# Patient Record
Sex: Female | Born: 1940
Health system: Southern US, Community
[De-identification: ages and names within clinical notes are randomized; demographics above are authoritative.]

## PROBLEM LIST (undated history)

## (undated) DIAGNOSIS — R0602 Shortness of breath: Secondary | ICD-10-CM

## (undated) DIAGNOSIS — K5900 Constipation, unspecified: Secondary | ICD-10-CM

## (undated) DIAGNOSIS — I6529 Occlusion and stenosis of unspecified carotid artery: Secondary | ICD-10-CM

## (undated) DIAGNOSIS — J40 Bronchitis, not specified as acute or chronic: Secondary | ICD-10-CM

## (undated) DIAGNOSIS — J449 Chronic obstructive pulmonary disease, unspecified: Secondary | ICD-10-CM

## (undated) DIAGNOSIS — K219 Gastro-esophageal reflux disease without esophagitis: Secondary | ICD-10-CM

## (undated) DIAGNOSIS — E039 Hypothyroidism, unspecified: Secondary | ICD-10-CM

## (undated) DIAGNOSIS — M1990 Unspecified osteoarthritis, unspecified site: Secondary | ICD-10-CM

## (undated) DIAGNOSIS — D751 Secondary polycythemia: Secondary | ICD-10-CM

## (undated) DIAGNOSIS — E785 Hyperlipidemia, unspecified: Secondary | ICD-10-CM

## (undated) DIAGNOSIS — M8580 Other specified disorders of bone density and structure, unspecified site: Secondary | ICD-10-CM

## (undated) DIAGNOSIS — I10 Essential (primary) hypertension: Secondary | ICD-10-CM

## (undated) DIAGNOSIS — F419 Anxiety disorder, unspecified: Secondary | ICD-10-CM

## (undated) DIAGNOSIS — Z9981 Dependence on supplemental oxygen: Secondary | ICD-10-CM

## (undated) DIAGNOSIS — Z87442 Personal history of urinary calculi: Secondary | ICD-10-CM

## (undated) DIAGNOSIS — E079 Disorder of thyroid, unspecified: Secondary | ICD-10-CM

## (undated) DIAGNOSIS — J440 Chronic obstructive pulmonary disease with acute lower respiratory infection: Secondary | ICD-10-CM

## (undated) HISTORY — DX: Bronchitis, not specified as acute or chronic: J40

## (undated) HISTORY — PX: TONSILLECTOMY: SUR1361

## (undated) HISTORY — DX: Chronic obstructive pulmonary disease with (acute) lower respiratory infection: J44.0

## (undated) HISTORY — DX: Occlusion and stenosis of unspecified carotid artery: I65.29

## (undated) HISTORY — DX: Other specified disorders of bone density and structure, unspecified site: M85.80

## (undated) HISTORY — DX: Gastro-esophageal reflux disease without esophagitis: K21.9

## (undated) HISTORY — PX: SUBCLAVIAN ARTERY STENT: SHX2452

## (undated) HISTORY — DX: Hyperlipidemia, unspecified: E78.5

## (undated) HISTORY — DX: Anxiety disorder, unspecified: F41.9

## (undated) HISTORY — DX: Chronic obstructive pulmonary disease, unspecified: J44.9

## (undated) HISTORY — DX: Essential (primary) hypertension: I10

## (undated) HISTORY — DX: Secondary polycythemia: D75.1

## (undated) HISTORY — DX: Disorder of thyroid, unspecified: E07.9

---

## 2011-04-01 HISTORY — PX: DIAGNOSTIC MAMMOGRAM: HXRAD719

## 2012-05-17 HISTORY — PX: OTHER SURGICAL HISTORY: SHX169

## 2012-07-30 ENCOUNTER — Other Ambulatory Visit: Payer: Self-pay | Admitting: *Deleted

## 2012-07-31 ENCOUNTER — Encounter: Payer: Self-pay | Admitting: Vascular Surgery

## 2012-08-19 ENCOUNTER — Encounter: Payer: Self-pay | Admitting: Vascular Surgery

## 2012-08-20 ENCOUNTER — Encounter: Payer: Self-pay | Admitting: Vascular Surgery

## 2012-08-20 ENCOUNTER — Other Ambulatory Visit (INDEPENDENT_AMBULATORY_CARE_PROVIDER_SITE_OTHER): Payer: Medicare PPO | Admitting: *Deleted

## 2012-08-20 ENCOUNTER — Ambulatory Visit (INDEPENDENT_AMBULATORY_CARE_PROVIDER_SITE_OTHER): Payer: Medicare PPO | Admitting: Vascular Surgery

## 2012-08-20 DIAGNOSIS — I6529 Occlusion and stenosis of unspecified carotid artery: Secondary | ICD-10-CM

## 2012-08-20 DIAGNOSIS — R0602 Shortness of breath: Secondary | ICD-10-CM | POA: Insufficient documentation

## 2012-08-20 NOTE — Progress Notes (Signed)
Subjective:     Patient ID: Margaret Villarreal, female   DOB: 07/07/1940, 71 y.o.   MRN: [id number]  HPI this 71-year-old female was referred by Dr. Schultz patient has had carotid artery disease followed for many years. The blockage on the left side has progressed to greater than 80% in severity. She has no history of stroke. She denies any evidence or history of lateralizing weakness, aphasia, amaurosis fugax, diplopia, blurred vision, or syncope. She does have a history of left subclavian artery stent placed in [place] for subclavian steal syndrome 5 years ago. She denies any transient weakness in her left upper extremity.  Past Medical History  Diagnosis Date  . Hyperlipidemia   . COPD (chronic obstructive pulmonary disease)   . Hypertension   . Thyroid disease     Hypo-Thyroidism  . Polycythemia, secondary   . Obstructive chronic bronchitis with acute bronchitis   . Bronchitis   . Anxiety   . Esophageal reflux   . Carotid artery occlusion     70% Left side and bilateral bruit    History  Substance Use Topics  . Smoking status: Current Every Day Smoker -- 1.00 packs/day    Types: Cigarettes  . Smokeless tobacco: Never Used  . Alcohol Use: No    Family History  Problem Relation Age of Onset  . Asthma Mother   . Heart attack Mother   . Diabetes Mother   . Thyroid disease Mother   . Heart disease Mother     Heart Disease before age 60  . Deep vein thrombosis Mother   . Heart attack Father   . Heart disease Father   . Thyroid disease Sister   . Arthritis Sister   . Diabetes Sister   . Cancer Sister     Skin  . Heart disease Sister   . Heart attack Sister   . Diabetes Brother     Allergies  Allergen Reactions  . Omnipaque (Iohexol) Anaphylaxis and Hives    I V dye  . Pepto-Bismol (Bismuth Subsalicylate) Nausea And Vomiting  . Hydrogen Peroxide Hives    Current outpatient prescriptions:ALPRAZolam (XANAX) 0.5 MG tablet, 2 (two) times daily., Disp: , Rfl: ;   aspirin 81 MG tablet, Take 81 mg by mouth daily., Disp: , Rfl: ;  atorvastatin (LIPITOR) 40 MG tablet, at bedtime., Disp: , Rfl: ;  clopidogrel (PLAVIX) 75 MG tablet, daily., Disp: , Rfl: ;  hydrochlorothiazide (MICROZIDE) 12.5 MG capsule, daily., Disp: , Rfl: ;  levothyroxine (SYNTHROID, LEVOTHROID) 88 MCG tablet, daily., Disp: , Rfl:  PROAIR HFA 108 (90 BASE) MCG/ACT inhaler, continuous as needed., Disp: , Rfl: ;  ranitidine (ZANTAC) 150 MG tablet, Take 150 mg by mouth at bedtime., Disp: , Rfl: ;  SPIRIVA HANDIHALER 18 MCG inhalation capsule, every morning., Disp: , Rfl: ;  ZETIA 10 MG tablet, daily., Disp: , Rfl:   BP 113/70  Pulse 88  Resp 16  Ht 5' (1.524 m)  Wt 140 lb (63.504 kg)  BMI 27.34 kg/m2  SpO2 100%  Body mass index is 27.34 kg/(m^2).           Review of Systems patient does complain of dyspnea on exertion, varicose veins, asthma, sleep apnea, weakness in arms and legs in the past, easy bruisability, and skin rashes. All other systems negative and complete review of systems     Objective:   Physical Exam blood pressure 113/70 heart rate 88 respirations 16 Gen.-alert and oriented x3 in no apparent distress HEENT   normal for age Lungs no rhonchi or wheezing Cardiovascular regular rhythm no murmurs carotid pulses 3+ palpable -harsh bruit on left Abdomen soft nontender no palpable masses Musculoskeletal free of  major deformities Skin clear -no rashes Neurologic normal Lower extremities 3+ femoral and dorsalis pedis pulses palpable bilaterally with no edema  Today I ordered a carotid duplex exam of the left carotid artery which I reviewed and interpreted and compared to previous studies. Patient does have a knee 5-90% left ICA stenosis with mild disease on the contralateral right side on previous exams.       Assessment:     Severe left ICA stenosis-asymptomatic Patient currently on Plavix and aspirin History of COPD and tobacco abuse    Plan:     Patient  needs left carotid endarterectomy-scheduled for Thursday, July 24 Patient will discontinue Plavix after 08/31/2012 Patient will continue aspirin Risks and benefits of surgery thoroughly discussed with patient and her family and they understand and would like to proceed with left carotid surgery      

## 2012-08-27 ENCOUNTER — Other Ambulatory Visit: Payer: Self-pay | Admitting: *Deleted

## 2012-08-27 ENCOUNTER — Encounter (HOSPITAL_COMMUNITY): Payer: Self-pay | Admitting: Pharmacy Technician

## 2012-08-30 ENCOUNTER — Encounter (HOSPITAL_COMMUNITY): Payer: Self-pay

## 2012-08-30 ENCOUNTER — Encounter (HOSPITAL_COMMUNITY)
Admission: RE | Admit: 2012-08-30 | Discharge: 2012-08-30 | Disposition: A | Discharge status: Home / Self Care | Payer: Medicare HMO | Source: Ambulatory Visit | Attending: Anesthesiology | Admitting: Anesthesiology

## 2012-08-30 ENCOUNTER — Ambulatory Visit (HOSPITAL_COMMUNITY): Payer: Medicare HMO

## 2012-08-30 ENCOUNTER — Encounter (HOSPITAL_COMMUNITY)
Admission: RE | Admit: 2012-08-30 | Discharge: 2012-08-30 | Disposition: A | Discharge status: Home / Self Care | Payer: Medicare HMO | Source: Ambulatory Visit | Attending: Vascular Surgery | Admitting: Vascular Surgery

## 2012-08-30 DIAGNOSIS — Z01818 Encounter for other preprocedural examination: Secondary | ICD-10-CM | POA: Insufficient documentation

## 2012-08-30 DIAGNOSIS — Z01811 Encounter for preprocedural respiratory examination: Secondary | ICD-10-CM | POA: Insufficient documentation

## 2012-08-30 DIAGNOSIS — Z01812 Encounter for preprocedural laboratory examination: Secondary | ICD-10-CM | POA: Insufficient documentation

## 2012-08-30 HISTORY — DX: Personal history of urinary calculi: Z87.442

## 2012-08-30 HISTORY — DX: Dependence on supplemental oxygen: Z99.81

## 2012-08-30 HISTORY — DX: Shortness of breath: R06.02

## 2012-08-30 HISTORY — DX: Constipation, unspecified: K59.00

## 2012-08-30 HISTORY — DX: Hypothyroidism, unspecified: E03.9

## 2012-08-30 HISTORY — DX: Unspecified osteoarthritis, unspecified site: M19.90

## 2012-08-30 LAB — URINE MICROSCOPIC-ADD ON

## 2012-08-30 LAB — CBC
HCT: 44 % (ref 36.0–46.0)
Hemoglobin: 15.6 g/dL — ABNORMAL HIGH (ref 12.0–15.0)
MCH: 32.4 pg (ref 26.0–34.0)
MCHC: 35.5 g/dL (ref 30.0–36.0)
MCV: 91.3 fL (ref 78.0–100.0)
RDW: 13.2 % (ref 11.5–15.5)

## 2012-08-30 LAB — COMPREHENSIVE METABOLIC PANEL
ALT: 10 U/L (ref 0–35)
AST: 15 U/L (ref 0–37)
Albumin: 3.8 g/dL (ref 3.5–5.2)
Alkaline Phosphatase: 135 U/L — ABNORMAL HIGH (ref 39–117)
BUN: 11 mg/dL (ref 6–23)
CO2: 28 mEq/L (ref 19–32)
Calcium: 9.4 mg/dL (ref 8.4–10.5)
Chloride: 103 mEq/L (ref 96–112)
Creatinine, Ser: 0.64 mg/dL (ref 0.50–1.10)
GFR calc Af Amer: >90 mL/min (ref >90)
GFR calc non Af Amer: 89 mL/min — ABNORMAL LOW (ref >90)
Glucose, Bld: 91 mg/dL (ref 70–99)
Potassium: 3.3 mEq/L — ABNORMAL LOW (ref 3.5–5.1)
Sodium: 140 mEq/L (ref 135–145)
Total Bilirubin: 0.7 mg/dL (ref 0.3–1.2)
Total Protein: 7.1 g/dL (ref 6.0–8.3)

## 2012-08-30 LAB — URINALYSIS, ROUTINE W REFLEX MICROSCOPIC
Bilirubin Urine: NEGATIVE
Glucose, UA: NEGATIVE mg/dL
Hgb urine dipstick: NEGATIVE
Protein, ur: NEGATIVE mg/dL
Specific Gravity, Urine: 1.008 (ref 1.005–1.030)
Urobilinogen, UA: 0.2 mg/dL (ref 0.0–1.0)

## 2012-08-30 LAB — PROTIME-INR
INR: 0.95 (ref 0.00–1.49)
Prothrombin Time: 12.5 seconds (ref 11.6–15.2)

## 2012-08-30 LAB — SURGICAL PCR SCREEN
MRSA, PCR: NEGATIVE
Staphylococcus aureus: NEGATIVE

## 2012-08-30 LAB — APTT: aPTT: 29 seconds (ref 24–37)

## 2012-08-30 NOTE — Pre-Procedure Instructions (Signed)
Margaret Villarreal  08/30/2012   Your procedure is scheduled on:  Thursday, July 24th.  Report to [facility] at 5:30 AM.  Call this number if you have problems the morning of surgery: [phone]   Remember:   Do not eat food or drink liquids after midnight.   Take these medicines the morning of surgery with A SIP OF WATER: levothyroxine (SYNTHROID, LEVOTHROID).  Use inhalers.     May take  ALPRAZolam Prudy Feeler) if needed    Do not wear jewelry, make-up or nail polish.  Do not wear lotions, powders, or perfumes. You may wear deodorant.  Do not shave 48 hours prior to surgery.   Do not bring valuables to the hospital.  [facility] is not responsible for any belongings or valuables.  Contacts, dentures or bridgework may not be worn into surgery.  Leave suitcase in the car. After surgery it may be brought to your room.  For patients admitted to the hospital, checkout time is 11:00 AM the day of discharge.   Patients discharged the day of surgery will not be allowed to drive home.  Name and phone number of your driver: -   Special Instructions: Shower using CHG 2 nights before surgery and the night before surgery.  If you shower the day of surgery use CHG.  Use special wash - you have one bottle of CHG for all showers.  You should use approximately 1/3 of the bottle for each shower.   Please read over the following fact sheets that you were given: Pain Booklet, Coughing and Deep Breathing, Blood Transfusion Information and Surgical Site Infection Prevention

## 2012-08-30 NOTE — Progress Notes (Signed)
Pt has seen Dr Kirtland Bouchard at [facility] in the past- "not in a while"  Denies chest pain states that she had a stress test at Dr Charm Rings office- I requested results.  Pt was instructed to stop Plavix after Sat &/19/14.

## 2012-08-31 LAB — ABO/RH: ABO/RH(D): A NEG

## 2012-09-04 ENCOUNTER — Encounter: Payer: Self-pay | Admitting: Internal Medicine

## 2012-09-04 MED ORDER — SODIUM CHLORIDE 0.9 % IV SOLN
INTRAVENOUS | Status: DC
  Administered 2012-09-05: 07:00:00 via INTRAVENOUS

## 2012-09-04 MED ORDER — DEXTROSE 5 % IV SOLN
1.5000 g | INTRAVENOUS | Status: AC
  Administered 2012-09-05: 1.5 g via INTRAVENOUS
  Filled 2012-09-04: qty 1.5

## 2012-09-05 ENCOUNTER — Inpatient Hospital Stay (HOSPITAL_COMMUNITY): Payer: Medicare HMO | Admitting: Anesthesiology

## 2012-09-05 ENCOUNTER — Telehealth: Payer: Self-pay | Admitting: Vascular Surgery

## 2012-09-05 ENCOUNTER — Encounter (HOSPITAL_COMMUNITY): Payer: Self-pay | Admitting: Anesthesiology

## 2012-09-05 ENCOUNTER — Encounter (HOSPITAL_COMMUNITY)
Admission: RE | Disposition: A | Discharge status: Home / Self Care | Payer: Self-pay | Source: Ambulatory Visit | Attending: Vascular Surgery

## 2012-09-05 ENCOUNTER — Encounter (HOSPITAL_COMMUNITY): Payer: Self-pay | Admitting: *Deleted

## 2012-09-05 ENCOUNTER — Inpatient Hospital Stay (HOSPITAL_COMMUNITY)
Admission: RE | Admit: 2012-09-05 | Discharge: 2012-09-06 | DRG: 039 | Disposition: A | Discharge status: Home / Self Care | Payer: Medicare HMO | Source: Ambulatory Visit | Attending: Vascular Surgery | Admitting: Vascular Surgery

## 2012-09-05 DIAGNOSIS — I10 Essential (primary) hypertension: Secondary | ICD-10-CM | POA: Diagnosis present

## 2012-09-05 DIAGNOSIS — F411 Generalized anxiety disorder: Secondary | ICD-10-CM | POA: Diagnosis present

## 2012-09-05 DIAGNOSIS — K219 Gastro-esophageal reflux disease without esophagitis: Secondary | ICD-10-CM | POA: Diagnosis present

## 2012-09-05 DIAGNOSIS — I6529 Occlusion and stenosis of unspecified carotid artery: Principal | ICD-10-CM | POA: Diagnosis present

## 2012-09-05 DIAGNOSIS — M19049 Primary osteoarthritis, unspecified hand: Secondary | ICD-10-CM | POA: Diagnosis present

## 2012-09-05 DIAGNOSIS — Z7902 Long term (current) use of antithrombotics/antiplatelets: Secondary | ICD-10-CM

## 2012-09-05 DIAGNOSIS — K5900 Constipation, unspecified: Secondary | ICD-10-CM | POA: Diagnosis present

## 2012-09-05 DIAGNOSIS — Z7982 Long term (current) use of aspirin: Secondary | ICD-10-CM

## 2012-09-05 DIAGNOSIS — D751 Secondary polycythemia: Secondary | ICD-10-CM | POA: Diagnosis present

## 2012-09-05 DIAGNOSIS — E785 Hyperlipidemia, unspecified: Secondary | ICD-10-CM | POA: Diagnosis present

## 2012-09-05 DIAGNOSIS — F17200 Nicotine dependence, unspecified, uncomplicated: Secondary | ICD-10-CM | POA: Diagnosis present

## 2012-09-05 DIAGNOSIS — E039 Hypothyroidism, unspecified: Secondary | ICD-10-CM | POA: Diagnosis present

## 2012-09-05 DIAGNOSIS — J4489 Other specified chronic obstructive pulmonary disease: Secondary | ICD-10-CM | POA: Diagnosis present

## 2012-09-05 DIAGNOSIS — Z79899 Other long term (current) drug therapy: Secondary | ICD-10-CM

## 2012-09-05 DIAGNOSIS — E119 Type 2 diabetes mellitus without complications: Secondary | ICD-10-CM | POA: Diagnosis present

## 2012-09-05 DIAGNOSIS — J449 Chronic obstructive pulmonary disease, unspecified: Secondary | ICD-10-CM | POA: Diagnosis present

## 2012-09-05 HISTORY — PX: CAROTID ENDARTERECTOMY: SUR193

## 2012-09-05 HISTORY — PX: ENDARTERECTOMY: SHX5162

## 2012-09-05 LAB — CBC
HCT: 40 % (ref 36.0–46.0)
Hemoglobin: 14 g/dL (ref 12.0–15.0)
MCV: 90.7 fL (ref 78.0–100.0)
RBC: 4.41 MIL/uL (ref 3.87–5.11)
RDW: 13.1 % (ref 11.5–15.5)
WBC: 12.6 10*3/uL — ABNORMAL HIGH (ref 4.0–10.5)

## 2012-09-05 LAB — CREATININE, SERUM
GFR calc Af Amer: >90 mL/min (ref >90)
GFR calc non Af Amer: >90 mL/min (ref >90)

## 2012-09-05 SURGERY — ENDARTERECTOMY, CAROTID
Anesthesia: General | Site: Neck | Laterality: Left | Wound class: Clean

## 2012-09-05 MED ORDER — HYDRALAZINE HCL 20 MG/ML IJ SOLN: 10.0000 mg | INTRAMUSCULAR | Status: DC | PRN

## 2012-09-05 MED ORDER — NEOSTIGMINE METHYLSULFATE 1 MG/ML IJ SOLN
INTRAMUSCULAR | Status: DC | PRN
  Administered 2012-09-05: 3 mg via INTRAVENOUS

## 2012-09-05 MED ORDER — ARTIFICIAL TEARS OP OINT
TOPICAL_OINTMENT | OPHTHALMIC | Status: DC | PRN
  Administered 2012-09-05: 1 via OPHTHALMIC

## 2012-09-05 MED ORDER — HYDROMORPHONE HCL PF 1 MG/ML IJ SOLN
INTRAMUSCULAR | Status: AC
  Filled 2012-09-05: qty 1

## 2012-09-05 MED ORDER — OXYCODONE HCL 5 MG PO TABS: 5.0000 mg | ORAL_TABLET | Freq: Once | ORAL | Status: DC | PRN

## 2012-09-05 MED ORDER — SODIUM CHLORIDE 0.9 % IV SOLN: 500.0000 mL | Freq: Once | INTRAVENOUS | Status: AC | PRN

## 2012-09-05 MED ORDER — ALBUTEROL SULFATE HFA 108 (90 BASE) MCG/ACT IN AERS: 2.0000 | INHALATION_SPRAY | Freq: Four times a day (QID) | RESPIRATORY_TRACT | Status: DC | PRN

## 2012-09-05 MED ORDER — DEXTROSE 5 % IV SOLN
1.5000 g | Freq: Two times a day (BID) | INTRAVENOUS | Status: DC
  Administered 2012-09-05: 1.5 g via INTRAVENOUS
  Filled 2012-09-05 (×2): qty 1.5

## 2012-09-05 MED ORDER — ESMOLOL HCL 10 MG/ML IV SOLN
INTRAVENOUS | Status: DC | PRN
  Administered 2012-09-05: 40 mg via INTRAVENOUS

## 2012-09-05 MED ORDER — ONDANSETRON HCL 4 MG/2ML IJ SOLN
INTRAMUSCULAR | Status: DC | PRN
  Administered 2012-09-05: 4 mg via INTRAVENOUS

## 2012-09-05 MED ORDER — MORPHINE SULFATE 2 MG/ML IJ SOLN: 2.0000 mg | INTRAMUSCULAR | Status: DC | PRN

## 2012-09-05 MED ORDER — ONDANSETRON HCL 4 MG/2ML IJ SOLN: 4.0000 mg | Freq: Four times a day (QID) | INTRAMUSCULAR | Status: DC | PRN

## 2012-09-05 MED ORDER — HYDROMORPHONE HCL PF 1 MG/ML IJ SOLN
0.2500 mg | INTRAMUSCULAR | Status: DC | PRN
  Administered 2012-09-05 (×2): 0.5 mg via INTRAVENOUS

## 2012-09-05 MED ORDER — GLYCOPYRROLATE 0.2 MG/ML IJ SOLN
INTRAMUSCULAR | Status: DC | PRN
  Administered 2012-09-05: 0.4 mg via INTRAVENOUS

## 2012-09-05 MED ORDER — TIOTROPIUM BROMIDE MONOHYDRATE 18 MCG IN CAPS: 18.0000 ug | ORAL_CAPSULE | Freq: Every morning | RESPIRATORY_TRACT | Status: DC

## 2012-09-05 MED ORDER — MOMETASONE FURO-FORMOTEROL FUM 100-5 MCG/ACT IN AERO
2.0000 | INHALATION_SPRAY | Freq: Two times a day (BID) | RESPIRATORY_TRACT | Status: DC
  Administered 2012-09-05 – 2012-09-06 (×2): 2 via RESPIRATORY_TRACT
  Filled 2012-09-05: qty 8.8

## 2012-09-05 MED ORDER — TIOTROPIUM BROMIDE MONOHYDRATE 18 MCG IN CAPS
18.0000 ug | ORAL_CAPSULE | Freq: Every day | RESPIRATORY_TRACT | Status: DC
  Administered 2012-09-06: 18 ug via RESPIRATORY_TRACT
  Filled 2012-09-05: qty 5

## 2012-09-05 MED ORDER — FAMOTIDINE 20 MG PO TABS
20.0000 mg | ORAL_TABLET | Freq: Every day | ORAL | Status: DC
  Administered 2012-09-05: 20 mg via ORAL
  Filled 2012-09-05 (×2): qty 1

## 2012-09-05 MED ORDER — LACTATED RINGERS IV SOLN
INTRAVENOUS | Status: DC | PRN
  Administered 2012-09-05: 07:00:00 via INTRAVENOUS

## 2012-09-05 MED ORDER — GUAIFENESIN-DM 100-10 MG/5ML PO SYRP: 15.0000 mL | ORAL_SOLUTION | ORAL | Status: DC | PRN

## 2012-09-05 MED ORDER — ASPIRIN EC 81 MG PO TBEC
81.0000 mg | DELAYED_RELEASE_TABLET | Freq: Every day | ORAL | Status: DC
  Administered 2012-09-05 – 2012-09-06 (×2): 81 mg via ORAL
  Filled 2012-09-05 (×2): qty 1

## 2012-09-05 MED ORDER — HYDROCHLOROTHIAZIDE 12.5 MG PO CAPS
12.5000 mg | ORAL_CAPSULE | Freq: Every day | ORAL | Status: DC
  Administered 2012-09-06: 12.5 mg via ORAL
  Filled 2012-09-05 (×2): qty 1

## 2012-09-05 MED ORDER — OXYCODONE HCL 5 MG PO TABS: 5.0000 mg | ORAL_TABLET | Freq: Four times a day (QID) | ORAL | Status: DC | PRN

## 2012-09-05 MED ORDER — PHENYLEPHRINE HCL 10 MG/ML IJ SOLN
10.0000 mg | INTRAVENOUS | Status: DC | PRN
  Administered 2012-09-05: 25 ug/min via INTRAVENOUS

## 2012-09-05 MED ORDER — DOCUSATE SODIUM 100 MG PO CAPS
100.0000 mg | ORAL_CAPSULE | Freq: Every day | ORAL | Status: DC
  Administered 2012-09-06: 100 mg via ORAL
  Filled 2012-09-05: qty 1

## 2012-09-05 MED ORDER — ALUM & MAG HYDROXIDE-SIMETH 200-200-20 MG/5ML PO SUSP: 15.0000 mL | ORAL | Status: DC | PRN

## 2012-09-05 MED ORDER — DOPAMINE-DEXTROSE 3.2-5 MG/ML-% IV SOLN: 3.0000 ug/kg/min | INTRAVENOUS | Status: DC

## 2012-09-05 MED ORDER — PROMETHAZINE HCL 25 MG/ML IJ SOLN
INTRAMUSCULAR | Status: AC
  Filled 2012-09-05: qty 1

## 2012-09-05 MED ORDER — POTASSIUM CHLORIDE CRYS ER 20 MEQ PO TBCR: 20.0000 meq | EXTENDED_RELEASE_TABLET | Freq: Once | ORAL | Status: AC | PRN

## 2012-09-05 MED ORDER — POLYETHYLENE GLYCOL 3350 17 G PO PACK
17.0000 g | PACK | Freq: Every day | ORAL | Status: DC
  Administered 2012-09-05: 17 g via ORAL
  Filled 2012-09-05 (×2): qty 1

## 2012-09-05 MED ORDER — ACETAMINOPHEN 325 MG PO TABS
325.0000 mg | ORAL_TABLET | ORAL | Status: DC | PRN
  Administered 2012-09-06: 650 mg via ORAL
  Filled 2012-09-05: qty 2

## 2012-09-05 MED ORDER — ALPRAZOLAM 0.5 MG PO TABS: 0.5000 mg | ORAL_TABLET | Freq: Every day | ORAL | Status: DC | PRN

## 2012-09-05 MED ORDER — LEVOTHYROXINE SODIUM 88 MCG PO TABS
88.0000 ug | ORAL_TABLET | Freq: Every day | ORAL | Status: DC
  Administered 2012-09-06: 88 ug via ORAL
  Filled 2012-09-05 (×2): qty 1

## 2012-09-05 MED ORDER — 0.9 % SODIUM CHLORIDE (POUR BTL) OPTIME
TOPICAL | Status: DC | PRN
  Administered 2012-09-05: 1000 mL

## 2012-09-05 MED ORDER — EZETIMIBE 10 MG PO TABS
10.0000 mg | ORAL_TABLET | Freq: Every day | ORAL | Status: DC
  Administered 2012-09-05 – 2012-09-06 (×2): 10 mg via ORAL
  Filled 2012-09-05 (×2): qty 1

## 2012-09-05 MED ORDER — PHENOL 1.4 % MT LIQD: 1.0000 | OROMUCOSAL | Status: DC | PRN

## 2012-09-05 MED ORDER — HEPARIN SODIUM (PORCINE) 1000 UNIT/ML IJ SOLN
INTRAMUSCULAR | Status: DC | PRN
  Administered 2012-09-05: 6000 [IU] via INTRAVENOUS

## 2012-09-05 MED ORDER — PROMETHAZINE HCL 25 MG/ML IJ SOLN
6.2500 mg | INTRAMUSCULAR | Status: DC | PRN
  Administered 2012-09-05: 6.25 mg via INTRAVENOUS

## 2012-09-05 MED ORDER — LIDOCAINE HCL (CARDIAC) 20 MG/ML IV SOLN
INTRAVENOUS | Status: DC | PRN
  Administered 2012-09-05: 80 mg via INTRAVENOUS

## 2012-09-05 MED ORDER — SODIUM CHLORIDE 0.9 % IR SOLN
Status: DC | PRN
  Administered 2012-09-05: 08:00:00

## 2012-09-05 MED ORDER — ROCURONIUM BROMIDE 100 MG/10ML IV SOLN
INTRAVENOUS | Status: DC | PRN
  Administered 2012-09-05: 40 mg via INTRAVENOUS
  Administered 2012-09-05: 10 mg via INTRAVENOUS

## 2012-09-05 MED ORDER — CLOPIDOGREL BISULFATE 75 MG PO TABS
75.0000 mg | ORAL_TABLET | Freq: Every day | ORAL | Status: DC
  Administered 2012-09-06: 75 mg via ORAL
  Filled 2012-09-05: qty 1

## 2012-09-05 MED ORDER — ACETAMINOPHEN 650 MG RE SUPP: 325.0000 mg | RECTAL | Status: DC | PRN

## 2012-09-05 MED ORDER — LABETALOL HCL 5 MG/ML IV SOLN: 10.0000 mg | INTRAVENOUS | Status: DC | PRN

## 2012-09-05 MED ORDER — FENTANYL CITRATE 0.05 MG/ML IJ SOLN
INTRAMUSCULAR | Status: DC | PRN
  Administered 2012-09-05: 50 ug via INTRAVENOUS
  Administered 2012-09-05 (×2): 100 ug via INTRAVENOUS

## 2012-09-05 MED ORDER — PROPOFOL 10 MG/ML IV BOLUS
INTRAVENOUS | Status: DC | PRN
  Administered 2012-09-05: 150 mg via INTRAVENOUS
  Administered 2012-09-05: 20 mg via INTRAVENOUS

## 2012-09-05 MED ORDER — METOPROLOL TARTRATE 1 MG/ML IV SOLN: 2.0000 mg | INTRAVENOUS | Status: DC | PRN

## 2012-09-05 MED ORDER — OXYCODONE HCL 5 MG/5ML PO SOLN: 5.0000 mg | Freq: Once | ORAL | Status: DC | PRN

## 2012-09-05 MED ORDER — ENOXAPARIN SODIUM 30 MG/0.3ML ~~LOC~~ SOLN
30.0000 mg | SUBCUTANEOUS | Status: DC
  Administered 2012-09-06: 30 mg via SUBCUTANEOUS
  Filled 2012-09-05: qty 0.3

## 2012-09-05 MED ORDER — OXYCODONE HCL 5 MG PO TABS: 5.0000 mg | ORAL_TABLET | ORAL | Status: DC | PRN

## 2012-09-05 MED ORDER — LIDOCAINE HCL (PF) 1 % IJ SOLN
INTRAMUSCULAR | Status: AC
  Filled 2012-09-05: qty 30

## 2012-09-05 MED ORDER — ATORVASTATIN CALCIUM 40 MG PO TABS
40.0000 mg | ORAL_TABLET | Freq: Every day | ORAL | Status: DC
  Administered 2012-09-05 – 2012-09-06 (×2): 40 mg via ORAL
  Filled 2012-09-05 (×2): qty 1

## 2012-09-05 MED ORDER — SODIUM CHLORIDE 0.9 % IV SOLN: INTRAVENOUS | Status: DC

## 2012-09-05 MED ORDER — PROTAMINE SULFATE 10 MG/ML IV SOLN
INTRAVENOUS | Status: DC | PRN
  Administered 2012-09-05 (×2): 20 mg via INTRAVENOUS
  Administered 2012-09-05: 10 mg via INTRAVENOUS

## 2012-09-05 SURGICAL SUPPLY — 42 items
CANISTER SUCTION 2500CC (MISCELLANEOUS) ×2 IMPLANT
CATH ROBINSON RED A/P 18FR (CATHETERS) ×2 IMPLANT
CATH SUCT 10FR WHISTLE TIP (CATHETERS) ×2 IMPLANT
CLIP TI MEDIUM 24 (CLIP) ×2 IMPLANT
CLIP TI WIDE RED SMALL 24 (CLIP) ×2 IMPLANT
CLOTH BEACON ORANGE TIMEOUT ST (SAFETY) ×2 IMPLANT
COVER SURGICAL LIGHT HANDLE (MISCELLANEOUS) ×2 IMPLANT
CRADLE DONUT ADULT HEAD (MISCELLANEOUS) ×2 IMPLANT
DECANTER SPIKE VIAL GLASS SM (MISCELLANEOUS) IMPLANT
DRAIN HEMOVAC 1/8 X 5 (WOUND CARE) IMPLANT
DRAPE WARM FLUID 44X44 (DRAPE) ×2 IMPLANT
DRSG COVADERM 4X8 (GAUZE/BANDAGES/DRESSINGS) ×1 IMPLANT
ELECT REM PT RETURN 9FT ADLT (ELECTROSURGICAL) ×2
ELECTRODE REM PT RTRN 9FT ADLT (ELECTROSURGICAL) ×1 IMPLANT
EVACUATOR SILICONE 100CC (DRAIN) IMPLANT
GLOVE BIO SURGEON STRL SZ 6.5 (GLOVE) ×3 IMPLANT
GLOVE BIOGEL PI IND STRL 6.5 (GLOVE) IMPLANT
GLOVE BIOGEL PI INDICATOR 6.5 (GLOVE) ×2
GLOVE SS BIOGEL STRL SZ 7 (GLOVE) ×1 IMPLANT
GLOVE SUPERSENSE BIOGEL SZ 7 (GLOVE) ×1
GOWN STRL NON-REIN LRG LVL3 (GOWN DISPOSABLE) ×4 IMPLANT
INSERT FOGARTY SM (MISCELLANEOUS) ×2 IMPLANT
KIT BASIN OR (CUSTOM PROCEDURE TRAY) ×2 IMPLANT
KIT ROOM TURNOVER OR (KITS) ×2 IMPLANT
NEEDLE 22X1 1/2 (OR ONLY) (NEEDLE) IMPLANT
NS IRRIG 1000ML POUR BTL (IV SOLUTION) ×4 IMPLANT
PACK CAROTID (CUSTOM PROCEDURE TRAY) ×2 IMPLANT
PAD ARMBOARD 7.5X6 YLW CONV (MISCELLANEOUS) ×4 IMPLANT
PATCH HEMASHIELD 8X75 (Vascular Products) ×1 IMPLANT
SHUNT CAROTID BYPASS 12FRX15.5 (VASCULAR PRODUCTS) IMPLANT
SUT PROLENE 6 0 C 1 24 (SUTURE) ×1 IMPLANT
SUT PROLENE 6 0 CC (SUTURE) ×3 IMPLANT
SUT SILK 2 0 FS (SUTURE) ×2 IMPLANT
SUT SILK 3 0 TIES 17X18 (SUTURE)
SUT SILK 3-0 18XBRD TIE BLK (SUTURE) IMPLANT
SUT VIC AB 2-0 CT1 27 (SUTURE) ×2
SUT VIC AB 2-0 CT1 TAPERPNT 27 (SUTURE) ×1 IMPLANT
SUT VIC AB 3-0 X1 27 (SUTURE) ×2 IMPLANT
SYR CONTROL 10ML LL (SYRINGE) IMPLANT
TOWEL OR 17X24 6PK STRL BLUE (TOWEL DISPOSABLE) ×2 IMPLANT
TOWEL OR 17X26 10 PK STRL BLUE (TOWEL DISPOSABLE) ×2 IMPLANT
WATER STERILE IRR 1000ML POUR (IV SOLUTION) ×2 IMPLANT

## 2012-09-05 NOTE — Transfer of Care (Signed)
Immediate Anesthesia Transfer of Care Note  Patient: Margaret Villarreal  Procedure(s) Performed: Procedure(s): ENDARTERECTOMY CAROTID with patch angioplasty (Left)  Patient Location: PACU  Anesthesia Type:General  Level of Consciousness: awake, alert , oriented and patient cooperative  Airway & Oxygen Therapy: Patient Spontanous Breathing and Patient connected to nasal cannula oxygen  Post-op Assessment: Report given to PACU RN, Post -op Vital signs reviewed and stable, Patient moving all extremities X 4 and Patient able to stick tongue midline  Post vital signs: Reviewed and stable  Complications: No apparent anesthesia complications

## 2012-09-05 NOTE — Progress Notes (Signed)
MEDICATION RELATED CONSULT NOTE - INITIAL   Pharmacy Consult:  Renally adjust antibiotics  Allergies  Allergen Reactions  . Omnipaque (Iohexol) Anaphylaxis and Hives    I V dye  . Pepto-Bismol (Bismuth Subsalicylate) Nausea And Vomiting  . Hydrogen Peroxide Hives    Patient Measurements: Height: 4' 11.84" (152 cm) Weight: 139 lb 15.9 oz (63.5 kg) IBW/kg (Calculated) : 45.14  Vital Signs: Temp: 98.1 F (36.7 C) (07/24 0949) Temp src: Oral (07/24 0548) BP: 104/57 mmHg (07/24 1245) Pulse Rate: 66 (07/24 1245) Intake/Output from this shift: Total I/O In: 1000 [I.V.:1000] Out: 150 [Blood:150]  Labs: No results found for this basename: WBC, HGB, HCT, PLT, APTT, CREATININE, LABCREA, CREATININE, CREAT24HRUR, MG, PHOS, ALBUMIN, PROT, ALBUMIN, AST, ALT, ALKPHOS, BILITOT, BILIDIR, IBILI,  in the last 72 hours Estimated Creatinine Clearance: 55 ml/min (by C-G formula based on Cr of 0.64).   Microbiology: Recent Results (from the past 720 hour(s))  SURGICAL PCR SCREEN     Status: None   Collection Time    08/30/12  2:45 PM      Result Value Range Status   MRSA, PCR NEGATIVE  NEGATIVE Final   Staphylococcus aureus NEGATIVE  NEGATIVE Final   Comment:            The Xpert SA Assay (FDA     approved for NASAL specimens     in patients over 70 years of age),     is one component of     a comprehensive surveillance     program.  Test performance has     been validated by [employer] for patients greater     than or equal to 70 year old.     It is not intended     to diagnose infection nor to     guide or monitor treatment.    Medical History: Past Medical History  Diagnosis Date  . Hyperlipidemia   . COPD (chronic obstructive pulmonary disease)   . Hypertension   . Thyroid disease     Hypo-Thyroidism  . Polycythemia, secondary   . Obstructive chronic bronchitis with acute bronchitis   . Bronchitis   . Anxiety   . Esophageal reflux   . Carotid artery occlusion     70% Left side and bilateral bruit  . Hypothyroidism   . Shortness of breath     with exertion  . Personal history of kidney stones   . History of oxygen administration     2l At bedtime  . Constipation   . Arthritis     hands        Assessment: 72 YOF s/p carotid endarterectomy to continue on Zinacef for post-op prophylaxis.  Pharmacy asked to ensure antibiotics are appropriately dosed.  No concern with renal function currently.   Goal of Therapy:  Infection prevention   Plan:  - Continue Zinacef 1.5gm IV Q12H x 2 doses as ordered - Pharmacy will sign off as further dosage adjustment is unnecessary.  Thank you for the consult!    Derrian Rodak D. Laney Potash, PharmD, BCPS Pager:  [phone] 09/05/2012, 2:25 PM

## 2012-09-05 NOTE — Op Note (Signed)
OPERATIVE REPORT  Date of Surgery: 09/05/2012  Surgeon: Josephina Gip, MD  Assistant: Lorrine Kin  Pre-op Diagnosis:  LEFT ICA STENOSIS-severe-asymptomatic  Post-op Diagnosis: Same Procedure: Procedure(s): Left carotid endarterectomy with Dacron patch angioplasty  Anesthesia: General  EBL: 100 cc  Complications: None  Procedure Details: The patient was taken to the operating room and placed in the supine position. Following induction of satisfactory general endotracheal anesthesia the left neck was prepped and draped in a routine sterile manner. Incision was made on the anterior border of the sternocleidomastoid muscle and carried down through the subcutaneous tissue and platysma using the Bovie. Care was taken not to injure the hypoglossal nerve.. The common internal and external carotid arteries were dissected free. There was a calcified atherosclerotic plaque at the carotid bifurcation extending up the internal carotid artery. A #10 shunt was then prepared and the patient was heparinized. The carotid vessels were occluded with vascular clamps. A longitudinal opening was made in the common carotid with a 15 blade extended up the internal carotid with the Potts scissors to a point distal to the disease. The plaque was approximately 95 % stenotic in severity. The distal vessel appeared normal. Shunt was inserted without difficulty reestablishing flow in about 2 minutes. A standard endarterectomy was performed with an eversion endarterectomy of the external carotid. The plaque feathered off  the distal internal carotid artery nicely not requiring any tacking sutures. The lumen was thoroughly irrigated with heparinized saline and loose debris all carefully removed. The arterotomy was then closed with a patch using continuous 6-0 Prolene. Prior to completion of the  Closure the  shunt was removed after approximately 30 minutes of shunt time. Flow was then reestablished up the external branch  initially followed by the internal branch. Protamine was given to her reverse the heparin.Following adequate hemostasis the wound was irrigated with saline and closed in layers with Vicryl ain a subcuticular fashion. Sterile dressing was applied and the patient taken to the recovery room in stable condition.  Josephina Gip, MD 09/05/2012 9:41 AM

## 2012-09-05 NOTE — Telephone Encounter (Signed)
Message copied by Jena Gauss on Thu Sep 05, 2012  3:01 PM ------      Message from: [place], [place] K      Created: Thu Sep 05, 2012 11:05 AM      Regarding: RE: Schedule       I would see them in 1 week            ----- Message -----         From: Jena Gauss         Sent: 09/05/2012  10:33 AM           To: Sharee Pimple, CMA      Subject: RE: Schedule                                             [facility] is on vacation 2 and 3 w out, do you want a 1 w f/u or a 4 w f/u?            ----- Message -----         From: Sharee Pimple, CMA         Sent: 09/05/2012   9:33 AM           To: [facility] Admin Pool      Subject: Schedule                                                             ----- Message -----         From: Dara Lords, PA-C         Sent: 09/05/2012   9:19 AM           To: Sharee Pimple, CMA            S/p left CEA 09/05/12.  F/u with Dr. Hart Rochester in 2 weeks.            Thanks,      Lelon Mast             ------

## 2012-09-05 NOTE — Anesthesia Procedure Notes (Signed)
Procedure Name: Intubation Date/Time: 09/05/2012 7:42 AM Performed by: Tyrone Nine Pre-anesthesia Checklist: Patient identified, Timeout performed, Emergency Drugs available, Suction available and Patient being monitored Patient Re-evaluated:Patient Re-evaluated prior to inductionOxygen Delivery Method: Circle system utilized Preoxygenation: Pre-oxygenation with 100% oxygen Intubation Type: IV induction Ventilation: Mask ventilation without difficulty Laryngoscope Size: Mac and Miller Grade View: Grade I Tube type: Oral Laser Tube: Cuffed inflated with minimal occlusive pressure - saline Tube size: 8.0 mm Number of attempts: 1 Airway Equipment and Method: Stylet Placement Confirmation: ETT inserted through vocal cords under direct vision,  positive ETCO2,  CO2 detector and breath sounds checked- equal and bilateral Secured at: 21 cm Tube secured with: Tape Dental Injury: Teeth and Oropharynx as per pre-operative assessment

## 2012-09-05 NOTE — Interval H&P Note (Signed)
History and Physical Interval Note:  09/05/2012 7:25 AM  Margaret Villarreal  has presented today for surgery, with the diagnosis of LEFT ICA STENOSIS  The various methods of treatment have been discussed with the patient and family. After consideration of risks, benefits and other options for treatment, the patient has consented to  Procedure(s): ENDARTERECTOMY CAROTID (Left) as a surgical intervention .  The patient's history has been reviewed, patient examined, no change in status, stable for surgery.  I have reviewed the patient's chart and labs.  Questions were answered to the patient's satisfaction.     Josephina Gip

## 2012-09-05 NOTE — H&P (View-Only) (Signed)
Subjective:     Patient ID: Margaret Villarreal, female   DOB: 1940-07-03, 72 y.o.   MRN: [id number]  HPI this 72 year old female was referred by Dr. Tomasa Blase patient has had carotid artery disease followed for many years. The blockage on the left side has progressed to greater than 80% in severity. She has no history of stroke. She denies any evidence or history of lateralizing weakness, aphasia, amaurosis fugax, diplopia, blurred vision, or syncope. She does have a history of left subclavian artery stent placed in [place] for subclavian steal syndrome 5 years ago. She denies any transient weakness in her left upper extremity.  Past Medical History  Diagnosis Date  . Hyperlipidemia   . COPD (chronic obstructive pulmonary disease)   . Hypertension   . Thyroid disease     Hypo-Thyroidism  . Polycythemia, secondary   . Obstructive chronic bronchitis with acute bronchitis   . Bronchitis   . Anxiety   . Esophageal reflux   . Carotid artery occlusion     70% Left side and bilateral bruit    History  Substance Use Topics  . Smoking status: Current Every Day Smoker -- 1.00 packs/day    Types: Cigarettes  . Smokeless tobacco: Never Used  . Alcohol Use: No    Family History  Problem Relation Age of Onset  . Asthma Mother   . Heart attack Mother   . Diabetes Mother   . Thyroid disease Mother   . Heart disease Mother     Heart Disease before age 22  . Deep vein thrombosis Mother   . Heart attack Father   . Heart disease Father   . Thyroid disease Sister   . Arthritis Sister   . Diabetes Sister   . Cancer Sister     Skin  . Heart disease Sister   . Heart attack Sister   . Diabetes Brother     Allergies  Allergen Reactions  . Omnipaque (Iohexol) Anaphylaxis and Hives    I V dye  . Pepto-Bismol (Bismuth Subsalicylate) Nausea And Vomiting  . Hydrogen Peroxide Hives    Current outpatient prescriptions:ALPRAZolam (XANAX) 0.5 MG tablet, 2 (two) times daily., Disp: , Rfl: ;   aspirin 81 MG tablet, Take 81 mg by mouth daily., Disp: , Rfl: ;  atorvastatin (LIPITOR) 40 MG tablet, at bedtime., Disp: , Rfl: ;  clopidogrel (PLAVIX) 75 MG tablet, daily., Disp: , Rfl: ;  hydrochlorothiazide (MICROZIDE) 12.5 MG capsule, daily., Disp: , Rfl: ;  levothyroxine (SYNTHROID, LEVOTHROID) 88 MCG tablet, daily., Disp: , Rfl:  PROAIR HFA 108 (90 BASE) MCG/ACT inhaler, continuous as needed., Disp: , Rfl: ;  ranitidine (ZANTAC) 150 MG tablet, Take 150 mg by mouth at bedtime., Disp: , Rfl: ;  SPIRIVA HANDIHALER 18 MCG inhalation capsule, every morning., Disp: , Rfl: ;  ZETIA 10 MG tablet, daily., Disp: , Rfl:   BP 113/70  Pulse 88  Resp 16  Ht 5' (1.524 m)  Wt 140 lb (63.504 kg)  BMI 27.34 kg/m2  SpO2 100%  Body mass index is 27.34 kg/(m^2).           Review of Systems patient does complain of dyspnea on exertion, varicose veins, asthma, sleep apnea, weakness in arms and legs in the past, easy bruisability, and skin rashes. All other systems negative and complete review of systems     Objective:   Physical Exam blood pressure 113/70 heart rate 88 respirations 16 Gen.-alert and oriented x3 in no apparent distress HEENT  normal for age Lungs no rhonchi or wheezing Cardiovascular regular rhythm no murmurs carotid pulses 3+ palpable -harsh bruit on left Abdomen soft nontender no palpable masses Musculoskeletal free of  major deformities Skin clear -no rashes Neurologic normal Lower extremities 3+ femoral and dorsalis pedis pulses palpable bilaterally with no edema  Today I ordered a carotid duplex exam of the left carotid artery which I reviewed and interpreted and compared to previous studies. Patient does have a knee 5-90% left ICA stenosis with mild disease on the contralateral right side on previous exams.       Assessment:     Severe left ICA stenosis-asymptomatic Patient currently on Plavix and aspirin History of COPD and tobacco abuse    Plan:     Patient  needs left carotid endarterectomy-scheduled for Thursday, July 24 Patient will discontinue Plavix after 08/31/2012 Patient will continue aspirin Risks and benefits of surgery thoroughly discussed with patient and her family and they understand and would like to proceed with left carotid surgery

## 2012-09-05 NOTE — Progress Notes (Signed)
Utilization review completed.  

## 2012-09-05 NOTE — Anesthesia Postprocedure Evaluation (Signed)
Anesthesia Post Note  Patient: Margaret Villarreal  Procedure(s) Performed: Procedure(s) (LRB): ENDARTERECTOMY CAROTID with patch angioplasty (Left)  Anesthesia type: general  Patient location: PACU  Post pain: Pain level controlled  Post assessment: Patient's Cardiovascular Status Stable  Last Vitals:  Filed Vitals:   09/05/12 1045  BP: 103/47  Pulse: 80  Temp:   Resp: 21    Post vital signs: Reviewed and stable  Level of consciousness: sedated  Complications: No apparent anesthesia complications

## 2012-09-05 NOTE — Anesthesia Preprocedure Evaluation (Addendum)
Anesthesia Evaluation  Patient identified by MRN, date of birth, ID band Patient awake    Reviewed: Allergy & Precautions, H&P , NPO status , Patient's Chart, lab work & pertinent test results  Airway Mallampati: III TM Distance: >3 FB     Dental  (+) Edentulous Upper, Partial Lower and Dental Advisory Given   Pulmonary shortness of breath and with exertion, COPD COPD inhaler, Current Smoker,    Pulmonary exam normal       Cardiovascular Exercise Tolerance: Poor hypertension, Pt. on medications + Peripheral Vascular Disease Rhythm:Regular Rate:Normal     Neuro/Psych Anxiety negative neurological ROS     GI/Hepatic Neg liver ROS, GERD-  Medicated and Controlled,  Endo/Other  diabetes, Well Controlled, Type 2, Oral Hypoglycemic AgentsHypothyroidism   Renal/GU negative Renal ROS     Musculoskeletal  (+) Arthritis -, Osteoarthritis,    Abdominal Normal abdominal exam  (+)   Peds  Hematology negative hematology ROS (+)   Anesthesia Other Findings   Reproductive/Obstetrics                         Anesthesia Physical Anesthesia Plan  ASA: III  Anesthesia Plan: General   Post-op Pain Management:    Induction: Intravenous  Airway Management Planned: Oral ETT  Additional Equipment: Arterial line  Intra-op Plan:   Post-operative Plan: Extubation in OR  Informed Consent: I have reviewed the patients History and Physical, chart, labs and discussed the procedure including the risks, benefits and alternatives for the proposed anesthesia with the patient or authorized representative who has indicated his/her understanding and acceptance.   Dental advisory given  Plan Discussed with: CRNA, Anesthesiologist and Surgeon  Anesthesia Plan Comments:        Anesthesia Quick Evaluation

## 2012-09-05 NOTE — Preoperative (Signed)
Beta Blockers   Reason not to administer Beta Blockers:Not Applicable 

## 2012-09-06 ENCOUNTER — Encounter (HOSPITAL_COMMUNITY): Payer: Self-pay | Admitting: Vascular Surgery

## 2012-09-06 LAB — TYPE AND SCREEN
Antibody Screen: NEGATIVE
Unit division: 0

## 2012-09-06 LAB — CBC
HCT: 37.7 % (ref 36.0–46.0)
Hemoglobin: 12.8 g/dL (ref 12.0–15.0)
MCH: 31.1 pg (ref 26.0–34.0)
MCV: 91.5 fL (ref 78.0–100.0)
RBC: 4.12 MIL/uL (ref 3.87–5.11)

## 2012-09-06 LAB — BASIC METABOLIC PANEL
BUN: 9 mg/dL (ref 6–23)
CO2: 26 mEq/L (ref 19–32)
Calcium: 8.4 mg/dL (ref 8.4–10.5)
Glucose, Bld: 129 mg/dL — ABNORMAL HIGH (ref 70–99)
Sodium: 140 mEq/L (ref 135–145)

## 2012-09-06 NOTE — Discharge Summary (Signed)
[facility] Discharge Summary  Margaret Villarreal 1940/06/12 72 y.o. female  [id number]  Admission Date: 09/05/2012  Discharge Date: 09/06/12  Physician: Pryor Ochoa, MD  Admission Diagnosis: LEFT ICA STENOSIS   HPI:   This is a 73 y.o. female who was referred by Dr. Tomasa Blase patient has had carotid artery disease followed for many years. The blockage on the left side has progressed to greater than 80% in severity. She has no history of stroke. She denies any evidence or history of lateralizing weakness, aphasia, amaurosis fugax, diplopia, blurred vision, or syncope. She does have a history of left subclavian artery stent placed in [place] for subclavian steal syndrome 5 years ago. She denies any transient weakness in her left upper extremity.   Hospital Course:  The patient was admitted to the hospital and taken to the operating room on 09/05/2012 and underwent left carotid endarterectomy.  The pt tolerated the procedure well and was transported to the PACU in good condition.   By POD 1, the pt neuro status in tact.  She is not having any difficulty swallowing.  The remainder of the hospital course consisted of increasing mobilization and increasing intake of solids without difficulty.    Recent Labs  09/06/12 0410  NA 140  K 3.7  CL 107  CO2 26  GLUCOSE 129*  BUN 9  CALCIUM 8.4    Recent Labs  09/05/12 1420 09/06/12 0410  WBC 12.6* 13.7*  HGB 14.0 12.8  HCT 40.0 37.7  PLT 197 219   No results found for this basename: INR,  in the last 72 hours  Discharge Instructions:   The patient is discharged to home with extensive instructions on wound care and progressive ambulation.  They are instructed not to drive or perform any heavy lifting until returning to see the physician in his office.  Discharge Orders   Future Appointments Provider Department Dept Phone   09/24/2012 9:15 AM Pryor Ochoa, MD [facility]  [phone]   Future Orders Complete By Expires     CAROTID Sugery: Call MD for difficulty swallowing or speaking; weakness in arms or legs that is a new symtom; severe headache.  If you have increased swelling in the neck and/or  are having difficulty breathing, CALL 911  As directed     Call MD for:  redness, tenderness, or signs of infection (pain, swelling, bleeding, redness, odor or green/yellow discharge around incision site)  As directed     Call MD for:  severe or increased pain, loss or decreased feeling  in affected limb(s)  As directed     Call MD for:  temperature >100.5  As directed     Discharge wound care:  As directed     Comments:      Shower daily with soap and water starting 09/07/12    Driving Restrictions  As directed     Comments:      No driving for 2 weeks    Lifting restrictions  As directed     Comments:      No lifting for 2 weeks    Resume previous diet  As directed        Discharge Diagnosis:  LEFT ICA STENOSIS  Secondary Diagnosis: Patient Active Problem List   Diagnosis Date Noted  . Occlusion and stenosis of carotid artery without mention of cerebral infarction 08/20/2012  . Shortness of breath 08/20/2012   Past Medical History  Diagnosis Date  .  Hyperlipidemia   . COPD (chronic obstructive pulmonary disease)   . Hypertension   . Thyroid disease     Hypo-Thyroidism  . Polycythemia, secondary   . Obstructive chronic bronchitis with acute bronchitis   . Bronchitis   . Anxiety   . Esophageal reflux   . Carotid artery occlusion     70% Left side and bilateral bruit  . Hypothyroidism   . Shortness of breath     with exertion  . Personal history of kidney stones   . History of oxygen administration     2l At bedtime  . Constipation   . Arthritis     hands      Medication List         ADVAIR DISKUS 250-50 MCG/DOSE Aepb  Generic drug:  Fluticasone-Salmeterol  Inhale 1 puff into the lungs every 12 (twelve) hours.     albuterol 108  (90 BASE) MCG/ACT inhaler  Commonly known as:  PROVENTIL HFA;VENTOLIN HFA  Inhale 2 puffs into the lungs every 6 (six) hours as needed for wheezing.     ALPRAZolam 0.5 MG tablet  Commonly known as:  XANAX  Take 0.5 mg by mouth daily as needed.     aspirin EC 81 MG tablet  Take 81 mg by mouth daily.     atorvastatin 40 MG tablet  Commonly known as:  LIPITOR  Take 40 mg by mouth daily.     clopidogrel 75 MG tablet  Commonly known as:  PLAVIX  Take 75 mg by mouth daily.     hydrochlorothiazide 12.5 MG capsule  Commonly known as:  MICROZIDE  Take 12.5 mg by mouth daily.     levothyroxine 88 MCG tablet  Commonly known as:  SYNTHROID, LEVOTHROID  Take 88 mcg by mouth daily.     oxyCODONE 5 MG immediate release tablet  Commonly known as:  ROXICODONE  Take 1 tablet (5 mg total) by mouth every 6 (six) hours as needed for pain.     polyethylene glycol packet  Commonly known as:  MIRALAX / GLYCOLAX  Take 17 g by mouth daily.     PRESCRIPTION MEDICATION  2 liters of Oxygen     ranitidine 150 MG tablet  Commonly known as:  ZANTAC  Take 150 mg by mouth at bedtime.     SPIRIVA HANDIHALER 18 MCG inhalation capsule  Generic drug:  tiotropium  Place 18 mcg into inhaler and inhale every morning.     ZETIA 10 MG tablet  Generic drug:  ezetimibe  Take 10 mg by mouth daily.        Roxicodone #30 No Refill  Disposition: home  Patient's condition: is Good  Follow up: 1. Dr.  Hart Rochester in 2 weeks.   Doreatha Massed, PA-C [facility] [phone]  --- For [facility] use --- Instructions: Press F2 to tab through selections.  Delete question if not applicable.   Modified Rankin score at D/C (0-6): 0  IV medication needed for:  1. Hypertension: No 2. Hypotension: No  Post-op Complications: No  1. Post-op CVA or TIA: No  If yes: Event classification (right eye, left eye, right cortical, left cortical, verterobasilar, other): n/a  If yes: Timing of  event (intra-op, <6 hrs post-op, >=6 hrs post-op, unknown): n/a  2. CN injury: No  If yes: CN n/a injuried   3. Myocardial infarction: No  If yes: Dx by (EKG or clinical, Troponin): n/a  4.  CHF: No  5.  Dysrhythmia (new):  No  6. Wound infection: No  7. Reperfusion symptoms: No  8. Return to OR: No  If yes: return to OR for (bleeding, neurologic, other CEA incision, other): n/a  Discharge medications: Statin use:  Yes If No: [ ]  For Medical reasons, [ ]  Non-compliant, [ ]  Not-indicated ASA use:  Yes  If No: [ ]  For Medical reasons, [ ]  Non-compliant, [ ]  Not-indicated Beta blocker use:  No If No: [ ]  For Medical reasons, [ ]  Non-compliant, [ ]  Not-indicated ACE-Inhibitor use:  No If No: [ ]  For Medical reasons, [ ]  Non-compliant, [ ]  Not-indicated P2Y12 Antagonist use: yes, [x]  Plavix, [ ]  Plasugrel, [ ]  Ticlopinine, [ ]  Ticagrelor, [ ]  Other, [ ]  No for medical reason, [ ]  Non-compliant, [ ]  Not-indicated Anti-coagulant use:  no, [ ]  Warfarin, [ ]  Rivaroxaban, [ ]  Dabigatran, [ ]  Other, [ ]  No for medical reason, [ ]  Non-compliant, [ ]  Not-indicated

## 2012-09-06 NOTE — Progress Notes (Signed)
Patient discharged by volunteer transporter at 1142 via wheelchair with son. Patient alert and oriented 4/4.

## 2012-09-06 NOTE — Progress Notes (Addendum)
[facility] Progress Note  09/06/2012 7:13 AM 1 Day Post-Op  Subjective:  No complaints this am  Afebrile 90's-100's systolic HR 60's-70's regular 13% 1.5LO2NC  Gtts:  none  Filed Vitals:   09/06/12 0400  BP: 108/55  Pulse: 86  Temp: 98.2 F (36.8 C)  Resp: 18     Physical Exam: Neuro:  In tact; no difficulty swallowing. Incision:  C/d/i with minimal swelling and mild ecchymosis  CBC    Component Value Date/Time   WBC 13.7* 09/06/2012 0410   RBC 4.12 09/06/2012 0410   HGB 12.8 09/06/2012 0410   HCT 37.7 09/06/2012 0410   PLT 219 09/06/2012 0410   MCV 91.5 09/06/2012 0410   MCH 31.1 09/06/2012 0410   MCHC 34.0 09/06/2012 0410   RDW 13.3 09/06/2012 0410    BMET    Component Value Date/Time   NA 140 09/06/2012 0410   K 3.7 09/06/2012 0410   CL 107 09/06/2012 0410   CO2 26 09/06/2012 0410   GLUCOSE 129* 09/06/2012 0410   BUN 9 09/06/2012 0410   CREATININE 0.57 09/06/2012 0410   CALCIUM 8.4 09/06/2012 0410   GFRNONAA >90 09/06/2012 0410   GFRAA >90 09/06/2012 0410     Intake/Output Summary (Last 24 hours) at 09/06/12 0713 Last data filed at 09/06/12 0300  Gross per 24 hour  Intake   2110 ml  Output   2650 ml  Net   -540 ml      Assessment/Plan:  This is a 72 y.o. female who is s/p left CEA 1 Day Post-Op  -pt is doing well this am. -pt neuro exam is in tact -pt has ambulated -will discharge home this am.  F/u with Dr. Hart Rochester in 2 weeks.   Doreatha Massed, PA-C [facility] [phone] Agree with above Neuro   Intact  DC home

## 2012-09-23 ENCOUNTER — Encounter: Payer: Self-pay | Admitting: Vascular Surgery

## 2012-09-24 ENCOUNTER — Encounter: Payer: Self-pay | Admitting: Vascular Surgery

## 2012-09-24 ENCOUNTER — Ambulatory Visit (INDEPENDENT_AMBULATORY_CARE_PROVIDER_SITE_OTHER): Payer: Medicare HMO | Admitting: Vascular Surgery

## 2012-09-24 VITALS — BP 138/84 | HR 98 | Resp 16 | Ht 60.0 in | Wt 140.0 lb

## 2012-09-24 DIAGNOSIS — I63239 Cerebral infarction due to unspecified occlusion or stenosis of unspecified carotid arteries: Secondary | ICD-10-CM

## 2012-09-24 DIAGNOSIS — Z48812 Encounter for surgical aftercare following surgery on the circulatory system: Secondary | ICD-10-CM

## 2012-09-24 NOTE — Progress Notes (Signed)
Subjective:     Patient ID: Margaret Villarreal, female   DOB: [birth year], 72 y.o.   MRN: [id number]  HPI this 72 year old female returns 2 weeks post left carotid endarterectomy for severe left ICA stenosis-asymptomatic. She has had no specific complaints since her surgery. She is swallowing well and has no hoarseness. She denies any lateralizing weakness or aphasia. She continues to take aspirin and Plavix  Review of Systems     Objective:   Physical Exam BP 138/84  Pulse 98  Resp 16  Ht 5' (1.524 m)  Wt 140 lb (63.504 kg)  BMI 27.34 kg/m2  General well-developed well-nourished female-elderly in no apparent distress Left neck incision healing nicely with no evidence of hematoma or infection. 3+ carotid pulse palpable with no audible bruit. Neurologic exam normal      Assessment:     Patient doing well 2 weeks post left carotid endarterectomy for severe asymptomatic stenosis    Plan:     Return in 6 months with carotid duplex exam and continue aspirin and Plavix. Will discuss continuation of Plavix with her medical doctor

## 2012-09-25 NOTE — Addendum Note (Signed)
Addended by: MCCHESNEY, MARILYN K on: 09/25/2012 08:48 AM   Modules accepted: Orders  

## 2012-10-15 ENCOUNTER — Telehealth: Payer: Self-pay

## 2012-10-15 ENCOUNTER — Ambulatory Visit (INDEPENDENT_AMBULATORY_CARE_PROVIDER_SITE_OTHER): Payer: Self-pay | Admitting: Vascular Surgery

## 2012-10-15 ENCOUNTER — Encounter: Payer: Self-pay | Admitting: Vascular Surgery

## 2012-10-15 VITALS — BP 122/93 | HR 88 | Resp 16 | Ht 60.0 in | Wt 140.0 lb

## 2012-10-15 DIAGNOSIS — I63239 Cerebral infarction due to unspecified occlusion or stenosis of unspecified carotid arteries: Secondary | ICD-10-CM

## 2012-10-15 NOTE — Progress Notes (Signed)
[facility] POST OPERATIVE OFFICE NOTE  CC:  F/u for surgery  HPI:  This is a 72 y.o. female who is s/p left CEA on 09/05/12.  She has been seen back by Dr. Hart Rochester ~ a month ago.  She returns today with concerns of a "knot" at the top of her incision.  She denies any fever, chills, drainage or any CVA symptoms.    Allergies  Allergen Reactions  . Omnipaque [Iohexol] Anaphylaxis and Hives    I V dye  . Pepto-Bismol [Bismuth Subsalicylate] Nausea And Vomiting  . Hydrogen Peroxide Hives    Current Outpatient Prescriptions  Medication Sig Dispense Refill  . albuterol (PROVENTIL HFA;VENTOLIN HFA) 108 (90 BASE) MCG/ACT inhaler Inhale 2 puffs into the lungs every 6 (six) hours as needed for wheezing.      Marland Kitchen ALPRAZolam (XANAX) 0.5 MG tablet Take 0.5 mg by mouth daily as needed.       Marland Kitchen aspirin EC 81 MG tablet Take 81 mg by mouth daily.      Marland Kitchen atorvastatin (LIPITOR) 40 MG tablet Take 40 mg by mouth daily.       . clopidogrel (PLAVIX) 75 MG tablet Take 75 mg by mouth daily.       . Fluticasone-Salmeterol (ADVAIR DISKUS) 250-50 MCG/DOSE AEPB Inhale 1 puff into the lungs every 12 (twelve) hours.      . hydrochlorothiazide (MICROZIDE) 12.5 MG capsule Take 12.5 mg by mouth daily.       Marland Kitchen levothyroxine (SYNTHROID, LEVOTHROID) 88 MCG tablet Take 88 mcg by mouth daily.       Marland Kitchen oxyCODONE (ROXICODONE) 5 MG immediate release tablet Take 1 tablet (5 mg total) by mouth every 6 (six) hours as needed for pain.  30 tablet  0  . polyethylene glycol (MIRALAX / GLYCOLAX) packet Take 17 g by mouth daily.      Marland Kitchen PRESCRIPTION MEDICATION 2 liters of Oxygen      . ranitidine (ZANTAC) 150 MG tablet Take 150 mg by mouth at bedtime.      Marland Kitchen SPIRIVA HANDIHALER 18 MCG inhalation capsule Place 18 mcg into inhaler and inhale every morning.       Marland Kitchen ZETIA 10 MG tablet Take 10 mg by mouth daily.        No current facility-administered medications for this visit.     ROS:  See HPI  Physical Exam:  Filed  Vitals:   10/15/12 1118  BP: 122/93  Pulse: 88  Resp: 16    Incision:  C/d/i; small dense area at top of incision; there is no drainage or erythema Neuro: in tact without deficit; neck has good ROM.  A/P:  This is a 72 y.o. female here for concerns of a "knot" at the top of her incision.  -the pt is doing well post operatively.  She is neurologically in tact.  There is a small area at the distal incision that could be a small hematoma.  There is no erythema or drainage and she has not had any fevers.  The pt is re-assured that this is most likely benign.  She has a follow up appt with carotid duplex in February 2015.  She will call if she has any further issues.    She has continued on her Plavix.  From our standpoint, she does not need this, but if her other physicians feels she needs to be on it, then she will continue the Plavix.  Doreatha Massed, PA-C [facility] [phone]  Clinic MD:  Pt seen and examined with Dr. Hart Rochester. Agree with above assessment No evidence on physical exam this suggest any infection or problems with carotid endarterectomy site  Will return in 5 months as previously scheduled for carotid duplex exam unless she develops any new symptoms

## 2012-10-15 NOTE — Telephone Encounter (Signed)
Pt. Called to report approx. quarter-sized knot at top of left neck incision.  States area is slightly red, warm, firm and burns.  Demoes amu drainage.  Denies fever/ chills.  Stated she first noticed it yesterday morning.  Offered appt. Today at 11:15 AM for eval.  Agrees with plan.

## 2013-04-01 ENCOUNTER — Ambulatory Visit: Payer: Medicare HMO | Admitting: Vascular Surgery

## 2013-04-01 ENCOUNTER — Other Ambulatory Visit (HOSPITAL_COMMUNITY): Payer: Medicare HMO

## 2013-04-14 ENCOUNTER — Encounter: Payer: Self-pay | Admitting: Vascular Surgery

## 2013-04-15 ENCOUNTER — Other Ambulatory Visit (HOSPITAL_COMMUNITY): Payer: Medicare HMO

## 2013-04-15 ENCOUNTER — Ambulatory Visit (INDEPENDENT_AMBULATORY_CARE_PROVIDER_SITE_OTHER): Payer: Medicare HMO | Admitting: Vascular Surgery

## 2013-04-15 ENCOUNTER — Encounter: Payer: Self-pay | Admitting: Vascular Surgery

## 2013-04-15 ENCOUNTER — Ambulatory Visit: Payer: Medicare HMO | Admitting: Vascular Surgery

## 2013-04-15 ENCOUNTER — Ambulatory Visit (HOSPITAL_COMMUNITY)
Admission: RE | Admit: 2013-04-15 | Discharge: 2013-04-15 | Disposition: A | Discharge status: Home / Self Care | Payer: Medicare HMO | Source: Ambulatory Visit | Attending: Vascular Surgery | Admitting: Vascular Surgery

## 2013-04-15 ENCOUNTER — Encounter (INDEPENDENT_AMBULATORY_CARE_PROVIDER_SITE_OTHER): Payer: Self-pay

## 2013-04-15 VITALS — BP 128/75 | HR 83 | Ht 60.0 in | Wt 149.3 lb

## 2013-04-15 DIAGNOSIS — Z48812 Encounter for surgical aftercare following surgery on the circulatory system: Secondary | ICD-10-CM | POA: Insufficient documentation

## 2013-04-15 DIAGNOSIS — I6529 Occlusion and stenosis of unspecified carotid artery: Secondary | ICD-10-CM

## 2013-04-15 DIAGNOSIS — I63239 Cerebral infarction due to unspecified occlusion or stenosis of unspecified carotid arteries: Secondary | ICD-10-CM | POA: Insufficient documentation

## 2013-04-15 DIAGNOSIS — I6320 Cerebral infarction due to unspecified occlusion or stenosis of unspecified precerebral arteries: Secondary | ICD-10-CM | POA: Insufficient documentation

## 2013-04-15 NOTE — Addendum Note (Signed)
Addended by: Sharee PimpleMCCHESNEY, MARILYN K on: 04/15/2013 03:13 PM   Modules accepted: Orders

## 2013-04-15 NOTE — Progress Notes (Signed)
Subjective:     Patient ID: Margaret Villarreal, female   DOB: 05/24/1940, 73 y.o.   MRN: [id number]  HPI this 73 year old female returns for followup regarding her left carotid endarterectomy which I performed 09/05/2012. She has had no neurologic symptoms since her surgery including lateralizing weakness, aphasia, or amaurosis fugax, diplopia, blurred vision, or syncope. She does take one aspirin per day. She has quit smoking completely since her surgery.   Past Medical History  Diagnosis Date  . Hyperlipidemia   . COPD (chronic obstructive pulmonary disease)   . Hypertension   . Thyroid disease     Hypo-Thyroidism  . Polycythemia, secondary   . Obstructive chronic bronchitis with acute bronchitis   . Bronchitis   . Anxiety   . Esophageal reflux   . Carotid artery occlusion     70% Left side and bilateral bruit  . Hypothyroidism   . Shortness of breath     with exertion  . Personal history of kidney stones   . History of oxygen administration     2l At bedtime  . Constipation   . Arthritis     hands    History  Substance Use Topics  . Smoking status: Former Smoker -- 1.00 packs/day for 54 years    Types: Cigarettes    Quit date: 09/04/2012  . Smokeless tobacco: Never Used  . Alcohol Use: No    Family History  Problem Relation Age of Onset  . Asthma Mother   . Heart attack Mother   . Diabetes Mother   . Thyroid disease Mother   . Heart disease Mother     Heart Disease before age 71  . Deep vein thrombosis Mother   . Heart attack Father   . Heart disease Father   . Thyroid disease Sister   . Arthritis Sister   . Diabetes Sister   . Cancer Sister     Skin  . Heart disease Sister   . Heart attack Sister   . Diabetes Brother     Allergies  Allergen Reactions  . Omnipaque [Iohexol] Anaphylaxis and Hives    I V dye  . Pepto-Bismol [Bismuth Subsalicylate] Nausea And Vomiting  . Hydrogen Peroxide Hives    Current outpatient prescriptions:albuterol (PROVENTIL  HFA;VENTOLIN HFA) 108 (90 BASE) MCG/ACT inhaler, Inhale 2 puffs into the lungs every 6 (six) hours as needed for wheezing., Disp: , Rfl: ;  ALPRAZolam (XANAX) 0.5 MG tablet, Take 0.5 mg by mouth daily as needed. , Disp: , Rfl: ;  aspirin EC 81 MG tablet, Take 81 mg by mouth daily., Disp: , Rfl: ;  atorvastatin (LIPITOR) 40 MG tablet, Take 40 mg by mouth daily. , Disp: , Rfl:  clopidogrel (PLAVIX) 75 MG tablet, Take 75 mg by mouth daily. , Disp: , Rfl: ;  Fluticasone-Salmeterol (ADVAIR DISKUS) 250-50 MCG/DOSE AEPB, Inhale 1 puff into the lungs every 12 (twelve) hours., Disp: , Rfl: ;  hydrochlorothiazide (MICROZIDE) 12.5 MG capsule, Take 12.5 mg by mouth daily. , Disp: , Rfl: ;  levothyroxine (SYNTHROID, LEVOTHROID) 88 MCG tablet, Take 88 mcg by mouth daily. , Disp: , Rfl:  oxyCODONE (ROXICODONE) 5 MG immediate release tablet, Take 1 tablet (5 mg total) by mouth every 6 (six) hours as needed for pain., Disp: 30 tablet, Rfl: 0;  polyethylene glycol (MIRALAX / GLYCOLAX) packet, Take 17 g by mouth daily., Disp: , Rfl: ;  PRESCRIPTION MEDICATION, 2 liters of Oxygen, Disp: , Rfl: ;  ranitidine (ZANTAC) 150 MG tablet, Take  150 mg by mouth at bedtime., Disp: , Rfl:  SPIRIVA HANDIHALER 18 MCG inhalation capsule, Place 18 mcg into inhaler and inhale every morning. , Disp: , Rfl: ;  ZETIA 10 MG tablet, Take 10 mg by mouth daily. , Disp: , Rfl:   BP 128/75  Pulse 83  Ht 5' (1.524 m)  Wt 149 lb 4.8 oz (67.722 kg)  BMI 29.16 kg/m2  SpO2 98%  Body mass index is 29.16 kg/(m^2).          Review of Systems denies chest pain, dyspnea on exertion, PND, orthopnea, asthma, hemoptysis, claudication. All other systems negative and complete review of systems    Objective:   Physical Exam BP 128/75  Pulse 83  Ht 5' (1.524 m)  Wt 149 lb 4.8 oz (67.722 kg)  BMI 29.16 kg/m2  SpO2 98%  Gen.-alert and oriented x3 in no apparent distress HEENT normal for age Lungs no rhonchi or wheezing Cardiovascular regular  rhythm no murmurs carotid pulses 3+ palpable no bruits audible Abdomen soft nontender no palpable masses Musculoskeletal free of  major deformities Skin clear -no rashes Neurologic normal Lower extremities 3+ femoral and dorsalis pedis pulses palpable bilaterally with no edema  Today I were to carotid duplex exam which I reviewed and interpreted. The right ICA has an approximate 60-70% stenosis. Left ICA has some mild evidence of restenosis in the midportion velocity of 208 cm/s.       Assessment:     Possible mild restenosis left internal carotid endarterectomy site with velocity 208 cm/s-asymptomatic Moderate right ICA stenosis-asymptomatic    Plan:     Return in 6 months with followup carotid duplex exam to follow both sides. Patient develops any symptomatology she will be in touch with [place] for earlier evaluation Continue daily aspirin

## 2013-10-13 ENCOUNTER — Encounter: Payer: Self-pay | Admitting: Family

## 2013-10-14 ENCOUNTER — Encounter: Payer: Self-pay | Admitting: Family

## 2013-10-14 ENCOUNTER — Ambulatory Visit (HOSPITAL_COMMUNITY)
Admission: RE | Admit: 2013-10-14 | Discharge: 2013-10-14 | Disposition: A | Discharge status: Home / Self Care | Payer: Medicare HMO | Source: Ambulatory Visit | Attending: Family | Admitting: Family

## 2013-10-14 ENCOUNTER — Ambulatory Visit (INDEPENDENT_AMBULATORY_CARE_PROVIDER_SITE_OTHER): Payer: Commercial Managed Care - HMO | Admitting: Family

## 2013-10-14 VITALS — BP 100/62 | HR 87 | Resp 16 | Ht 60.0 in | Wt 147.0 lb

## 2013-10-14 DIAGNOSIS — Z87891 Personal history of nicotine dependence: Secondary | ICD-10-CM | POA: Diagnosis not present

## 2013-10-14 DIAGNOSIS — I6529 Occlusion and stenosis of unspecified carotid artery: Secondary | ICD-10-CM

## 2013-10-14 DIAGNOSIS — Z8249 Family history of ischemic heart disease and other diseases of the circulatory system: Secondary | ICD-10-CM | POA: Diagnosis not present

## 2013-10-14 DIAGNOSIS — Z9889 Other specified postprocedural states: Secondary | ICD-10-CM | POA: Insufficient documentation

## 2013-10-14 DIAGNOSIS — Z7901 Long term (current) use of anticoagulants: Secondary | ICD-10-CM | POA: Insufficient documentation

## 2013-10-14 DIAGNOSIS — Z7982 Long term (current) use of aspirin: Secondary | ICD-10-CM | POA: Insufficient documentation

## 2013-10-14 DIAGNOSIS — Z48812 Encounter for surgical aftercare following surgery on the circulatory system: Secondary | ICD-10-CM

## 2013-10-14 NOTE — Progress Notes (Signed)
Established Carotid Patient   History of Present Illness  Margaret Villarreal is a 73 y.o. female patient of Dr. Hart Rochester who returns for followup regarding her left carotid endarterectomy performed on  09/05/2012.  Patient has Negative history of TIA or stroke symptom.  The patient denies amaurosis fugax or monocular blindness.  The patient  denies facial drooping.  Pt. denies hemiplegia.  The patient denies receptive or expressive aphasia.  Pt. denies extremity weakness.  Pt denies tingling, numbness, or aching in either arm or hand.  Pt denies claudication symptoms in legs with walking.   Pt denies New Medical or Surgical History.  Pt Diabetic: No Pt smoker: former smoker, quit in 2014  Pt meds include: Statin : Yes ASA: Yes Other anticoagulants/antiplatelets: Plavix   Past Medical History  Diagnosis Date  . Hyperlipidemia   . COPD (chronic obstructive pulmonary disease)   . Hypertension   . Thyroid disease     Hypo-Thyroidism  . Polycythemia, secondary   . Obstructive chronic bronchitis with acute bronchitis   . Bronchitis   . Anxiety   . Esophageal reflux   . Carotid artery occlusion     70% Left side and bilateral bruit  . Hypothyroidism   . Shortness of breath     with exertion  . Personal history of kidney stones   . History of oxygen administration     2l At bedtime  . Constipation   . Arthritis     hands    Social History History  Substance Use Topics  . Smoking status: Former Smoker -- 1.00 packs/day for 54 years    Types: Cigarettes    Quit date: 09/04/2012  . Smokeless tobacco: Never Used  . Alcohol Use: No    Family History Family History  Problem Relation Age of Onset  . Asthma Mother   . Heart attack Mother   . Diabetes Mother   . Thyroid disease Mother   . Heart disease Mother     Heart Disease before age 45  . Deep vein thrombosis Mother   . Cancer Mother   . Varicose Veins Mother   . Heart attack Father   . Heart disease Father    . Thyroid disease Sister   . Arthritis Sister   . Diabetes Sister   . Cancer Sister     Skin- and Mouth mucosa  . Heart disease Sister     Before age 35  . Heart attack Sister   . Hyperlipidemia Sister   . Varicose Veins Sister   . Crohn's disease Sister   . Diabetes Brother   . Diabetes Son     Surgical History Past Surgical History  Procedure Laterality Date  . Subclavian artery stent    . Diagnostic mammogram  04/01/11  . Bone density  05/17/12  . Cesarean section  11/20/80  . Endarterectomy Left 09/05/2012    Procedure: ENDARTERECTOMY CAROTID with patch angioplasty;  Surgeon: Pryor Ochoa, MD;  Location: [facility] OR;  Service: Vascular;  Laterality: Left;  . Carotid endarterectomy Left 09-05-12    CE    Allergies  Allergen Reactions  . Omnipaque [Iohexol] Anaphylaxis and Hives    I V dye  . Pepto-Bismol [Bismuth Subsalicylate] Nausea And Vomiting  . Hydrogen Peroxide Hives    Current Outpatient Prescriptions  Medication Sig Dispense Refill  . albuterol (PROVENTIL HFA;VENTOLIN HFA) 108 (90 BASE) MCG/ACT inhaler Inhale 2 puffs into the lungs every 6 (six) hours as needed for wheezing.      Marland Kitchen  ALPRAZolam (XANAX) 0.5 MG tablet Take 0.5 mg by mouth daily as needed.       Marland Kitchen aspirin EC 81 MG tablet Take 81 mg by mouth daily.      Marland Kitchen atorvastatin (LIPITOR) 40 MG tablet Take 40 mg by mouth daily.       . clopidogrel (PLAVIX) 75 MG tablet Take 75 mg by mouth daily.       . Fluticasone-Salmeterol (ADVAIR DISKUS) 250-50 MCG/DOSE AEPB Inhale 1 puff into the lungs every 12 (twelve) hours.      . hydrochlorothiazide (MICROZIDE) 12.5 MG capsule Take 12.5 mg by mouth daily.       Marland Kitchen levothyroxine (SYNTHROID, LEVOTHROID) 88 MCG tablet Take 88 mcg by mouth daily.       Marland Kitchen oxyCODONE (ROXICODONE) 5 MG immediate release tablet Take 1 tablet (5 mg total) by mouth every 6 (six) hours as needed for pain.  30 tablet  0  . polyethylene glycol (MIRALAX / GLYCOLAX) packet Take 17 g by mouth daily.      Marland Kitchen  PRESCRIPTION MEDICATION 2 liters of Oxygen      . ranitidine (ZANTAC) 150 MG tablet Take 150 mg by mouth at bedtime.      Marland Kitchen SPIRIVA HANDIHALER 18 MCG inhalation capsule Place 18 mcg into inhaler and inhale every morning.       Marland Kitchen Umeclidinium-Vilanterol (ANORO ELLIPTA) 62.5-25 MCG/INH AEPB Inhale into the lungs continuous as needed.      Marland Kitchen ZETIA 10 MG tablet Take 10 mg by mouth daily.        No current facility-administered medications for this visit.    Review of Systems : See HPI for pertinent positives and negatives.  Physical Examination  Filed Vitals:   10/14/13 1042 10/14/13 1045  BP: 106/71 100/62  Pulse: 88 87  Resp:  16  Height:  5' (1.524 m)  Weight:  147 lb (66.679 kg)  SpO2:  97%   Body mass index is 28.71 kg/(m^2).  General: WDWN female in NAD GAIT: normal Eyes: PERRLA Pulmonary:  Non-labored, CTAB, Negative  Rales, Negative rhonchi, & Negative wheezing.  Cardiac: regular Rhythm ,  Negative detected murmur.  VASCULAR EXAM Carotid Bruits Right Left   Negative Negative    Aorta is not palpable. Radial pulses are 2+ palpable and equal.                                                                                                                       Gastrointestinal: soft, nontender, BS WNL, no r/g,  negative masses palpated.  Musculoskeletal: Negative muscle atrophy/wasting. M/S 5/5 throughout, Extremities without ischemic changes.  Neurologic: A&O X 3; Appropriate Affect ; SENSATION ;normal;  Speech is normal CN 2-12 intact, Pain and light touch intact in extremities, Motor exam as listed above.   Non-Invasive Vascular Imaging CAROTID DUPLEX 10/14/2013   CEREBROVASCULAR DUPLEX EVALUATION    INDICATION: Carotid endarterectomy     PREVIOUS INTERVENTION(S): Left carotid endarterectomy on 09/05/12    DUPLEX EXAM:  RIGHT  LEFT  Peak Systolic Velocities (cm/s) End Diastolic Velocities (cm/s) Plaque LOCATION Peak Systolic Velocities (cm/s) End Diastolic  Velocities (cm/s) Plaque  84 18  CCA PROXIMAL 96 27   76 21 HT CCA MID 99 30 HT  81 27 CP CCA DISTAL 104 29   201 19 CP ECA 117 14   209 73 CP ICA PROXIMAL 86 26   116 36  ICA MID 182 66   68 23  ICA DISTAL 106 36     2.6 ICA / CCA Ratio (PSV) Not Calculated  Antegrade Vertebral Flow Antegrade  144 Brachial Systolic Pressure (mmHg) 124  Multiphasic (subclavian artery) Brachial Artery Waveforms Multiphasic (subclavian artery)    Plaque Morphology:  HM = Homogeneous, HT = Heterogeneous, CP = Calcific Plaque, SP = Smooth Plaque, IP = Irregular Plaque     ADDITIONAL FINDINGS:   No significant stenosis of the left external or bilateral common carotid arteries.   Right external carotid artery stenosis noted.   Velocity of 313cm/s noted in the left proximal subclavian artery.    IMPRESSION: 1. Doppler velocities suggest a 60-79% stenosis of the right proximal internal carotid artery. 2. Patent left carotid endarterectomy site with an elevated velocity of the left mid internal carotid artery, near the distal patch, which may be due to a change in vessel diameter versus soft plaque, based on decreased visualization.    Compared to the previous exam:  Velocity of the left mid internal carotid artery appears less than previously recorded when compared to the previous exam on 04/15/13 with the right internal carotid artery velocities remaining stable.      Assessment: Margaret Villarreal is a 73 y.o. female who presents with asymptomatic 60-79% stenosis of the right proximal internal carotid artery and patent left carotid endarterectomy site with an elevated velocity of the left mid internal carotid artery, near the distal patch, which may be due to a change in vessel diameter versus soft plaque, based on decreased visualization. Velocity of the left mid internal carotid artery appears less than previously recorded when compared to the previous exam on 04/15/13 with the right internal carotid artery  velocities remaining stable. She stopped smoking last year and was congratulated re this.   Plan: Follow-up in 6 months with Carotid Duplex scan.   I discussed in depth with the patient the nature of atherosclerosis, and emphasized the importance of maximal medical management including strict control of blood pressure, blood glucose, and lipid levels, obtaining regular exercise, and continued cessation of smoking.  The patient is aware that without maximal medical management the underlying atherosclerotic disease process will progress, limiting the benefit of any interventions. The patient was given information about stroke prevention and what symptoms should prompt the patient to seek immediate medical care. Thank you for allowing [place] to participate in this patient's care.  Charisse March, RN, MSN, FNP-C [facility] [place] Office: [phone]  Clinic Physician: Hart Rochester  10/14/2013 10:55 AM

## 2013-10-14 NOTE — Addendum Note (Signed)
Addended by: Sharee Pimple on: 10/14/2013 02:40 PM   Modules accepted: Orders

## 2013-10-14 NOTE — Patient Instructions (Signed)
Stroke Prevention Some medical conditions and behaviors are associated with an increased chance of having a stroke. You may prevent a stroke by making healthy choices and managing medical conditions. HOW CAN I REDUCE MY RISK OF HAVING A STROKE?   Stay physically active. Get at least 30 minutes of activity on most or all days.  Do not smoke. It may also be helpful to avoid exposure to secondhand smoke.  Limit alcohol use. Moderate alcohol use is considered to be:  No more than 2 drinks per day for men.  No more than 1 drink per day for nonpregnant women.  Eat healthy foods. This involves:  Eating 5 or more servings of fruits and vegetables a day.  Making dietary changes that address high blood pressure (hypertension), high cholesterol, diabetes, or obesity.  Manage your cholesterol levels.  Making food choices that are high in fiber and low in saturated fat, trans fat, and cholesterol may control cholesterol levels.  Take any prescribed medicines to control cholesterol as directed by your health care provider.  Manage your diabetes.  Controlling your carbohydrate and sugar intake is recommended to manage diabetes.  Take any prescribed medicines to control diabetes as directed by your health care provider.  Control your hypertension.  Making food choices that are low in salt (sodium), saturated fat, trans fat, and cholesterol is recommended to manage hypertension.  Take any prescribed medicines to control hypertension as directed by your health care provider.  Maintain a healthy weight.  Reducing calorie intake and making food choices that are low in sodium, saturated fat, trans fat, and cholesterol are recommended to manage weight.  Stop drug abuse.  Avoid taking birth control pills.  Talk to your health care provider about the risks of taking birth control pills if you are over 35 years old, smoke, get migraines, or have ever had a blood clot.  Get evaluated for sleep  disorders (sleep apnea).  Talk to your health care provider about getting a sleep evaluation if you snore a lot or have excessive sleepiness.  Take medicines only as directed by your health care provider.  For some people, aspirin or blood thinners (anticoagulants) are helpful in reducing the risk of forming abnormal blood clots that can lead to stroke. If you have the irregular heart rhythm of atrial fibrillation, you should be on a blood thinner unless there is a good reason you cannot take them.  Understand all your medicine instructions.  Make sure that other conditions (such as anemia or atherosclerosis) are addressed. SEEK IMMEDIATE MEDICAL CARE IF:   You have sudden weakness or numbness of the face, arm, or leg, especially on one side of the body.  Your face or eyelid droops to one side.  You have sudden confusion.  You have trouble speaking (aphasia) or understanding.  You have sudden trouble seeing in one or both eyes.  You have sudden trouble walking.  You have dizziness.  You have a loss of balance or coordination.  You have a sudden, severe headache with no known cause.  You have new chest pain or an irregular heartbeat. Any of these symptoms may represent a serious problem that is an emergency. Do not wait to see if the symptoms will go away. Get medical help at once. Call your local emergency services (911 in [place] Do not drive yourself to the hospital. Document Released: 03/09/2004 Document Revised: 06/16/2013 Document Reviewed: 08/02/2012 Exit[employer] Patient Information 2015 [employer]. This information is not intended to replace advice given   to you by your health care provider. Make sure you discuss any questions you have with your health care provider.  

## 2014-04-21 ENCOUNTER — Ambulatory Visit: Payer: Commercial Managed Care - HMO | Admitting: Family

## 2014-04-21 ENCOUNTER — Other Ambulatory Visit (HOSPITAL_COMMUNITY): Payer: Commercial Managed Care - HMO

## 2014-04-28 ENCOUNTER — Ambulatory Visit: Payer: Commercial Managed Care - HMO | Admitting: Family

## 2014-04-28 ENCOUNTER — Other Ambulatory Visit (HOSPITAL_COMMUNITY): Payer: Commercial Managed Care - HMO

## 2014-05-05 ENCOUNTER — Encounter: Payer: Self-pay | Admitting: Family

## 2014-05-06 ENCOUNTER — Other Ambulatory Visit (HOSPITAL_COMMUNITY): Payer: Commercial Managed Care - HMO

## 2014-05-06 ENCOUNTER — Ambulatory Visit: Payer: Commercial Managed Care - HMO | Admitting: Family

## 2014-06-09 ENCOUNTER — Encounter: Payer: Self-pay | Admitting: Family

## 2014-06-10 ENCOUNTER — Encounter: Payer: Self-pay | Admitting: Cardiology

## 2014-06-10 ENCOUNTER — Encounter: Payer: Self-pay | Admitting: Family

## 2014-06-10 ENCOUNTER — Ambulatory Visit (INDEPENDENT_AMBULATORY_CARE_PROVIDER_SITE_OTHER): Payer: Commercial Managed Care - HMO | Admitting: Family

## 2014-06-10 ENCOUNTER — Ambulatory Visit (HOSPITAL_COMMUNITY)
Admission: RE | Admit: 2014-06-10 | Discharge: 2014-06-10 | Disposition: A | Discharge status: Home / Self Care | Payer: Commercial Managed Care - HMO | Source: Ambulatory Visit | Attending: Family | Admitting: Family

## 2014-06-10 VITALS — BP 100/70 | HR 92 | Resp 16 | Ht 60.0 in | Wt 139.0 lb

## 2014-06-10 DIAGNOSIS — Z9889 Other specified postprocedural states: Secondary | ICD-10-CM

## 2014-06-10 DIAGNOSIS — I6521 Occlusion and stenosis of right carotid artery: Secondary | ICD-10-CM

## 2014-06-10 DIAGNOSIS — Z48812 Encounter for surgical aftercare following surgery on the circulatory system: Secondary | ICD-10-CM | POA: Insufficient documentation

## 2014-06-10 DIAGNOSIS — Z87891 Personal history of nicotine dependence: Secondary | ICD-10-CM | POA: Diagnosis not present

## 2014-06-10 DIAGNOSIS — I6523 Occlusion and stenosis of bilateral carotid arteries: Secondary | ICD-10-CM | POA: Diagnosis not present

## 2014-06-10 NOTE — Progress Notes (Signed)
Established Carotid Patient   History of Present Illness  Margaret Villarreal is a 74 y.o. female patient of Dr. Hart Rochester who returns for followup regarding her left carotid endarterectomy performed on 09/05/2012.  Patient has no history of TIA or stroke, specifically she denies a history of amaurosis fugax or monocular blindness, denies facial drooping, denies hemiplegia, denies receptive or expressive aphasia.   Pt denies tingling, numbness, or aching in either arm or hand.  Pt denies claudication symptoms in legs with walking, she walks a great deal.  Pt denies New Medical or Surgical History.  Pt Diabetic: No Pt smoker: former smoker, quit in 2014  Pt meds include: Statin : Yes ASA: Yes Other anticoagulants/antiplatelets: Plavix   Past Medical History  Diagnosis Date  . Hyperlipidemia   . COPD (chronic obstructive pulmonary disease)   . Hypertension   . Thyroid disease     Hypo-Thyroidism  . Polycythemia, secondary   . Obstructive chronic bronchitis with acute bronchitis   . Bronchitis   . Anxiety   . Esophageal reflux   . Carotid artery occlusion     70% Left side and bilateral bruit  . Hypothyroidism   . Shortness of breath     with exertion  . Personal history of kidney stones   . History of oxygen administration     2l At bedtime  . Constipation   . Arthritis     hands    Social History History  Substance Use Topics  . Smoking status: Former Smoker -- 1.00 packs/day for 54 years    Types: Cigarettes    Quit date: 09/04/2012  . Smokeless tobacco: Never Used  . Alcohol Use: No    Family History Family History  Problem Relation Age of Onset  . Asthma Mother   . Heart attack Mother   . Diabetes Mother   . Thyroid disease Mother   . Heart disease Mother     Heart Disease before age 7  . Deep vein thrombosis Mother   . Cancer Mother   . Varicose Veins Mother   . Heart attack Father   . Heart disease Father   . Thyroid disease Sister   .  Arthritis Sister   . Diabetes Sister   . Cancer Sister     Skin- and Mouth mucosa  . Heart disease Sister     Before age 1  . Heart attack Sister   . Hyperlipidemia Sister   . Varicose Veins Sister   . Crohn's disease Sister   . Diabetes Brother   . Diabetes Son     Surgical History Past Surgical History  Procedure Laterality Date  . Subclavian artery stent    . Diagnostic mammogram  04/01/11  . Bone density  05/17/12  . Cesarean section  11/20/80  . Endarterectomy Left 09/05/2012    Procedure: ENDARTERECTOMY CAROTID with patch angioplasty;  Surgeon: Pryor Ochoa, MD;  Location: [facility] OR;  Service: Vascular;  Laterality: Left;  . Carotid endarterectomy Left 09-05-12    CE    Allergies  Allergen Reactions  . Omnipaque [Iohexol] Anaphylaxis and Hives    I V dye  . Pepto-Bismol [Bismuth Subsalicylate] Nausea And Vomiting  . Hydrogen Peroxide Hives    Current Outpatient Prescriptions  Medication Sig Dispense Refill  . albuterol (PROVENTIL HFA;VENTOLIN HFA) 108 (90 BASE) MCG/ACT inhaler Inhale 2 puffs into the lungs every 6 (six) hours as needed for wheezing.    Marland Kitchen aspirin EC 81 MG tablet Take 81 mg  by mouth daily.    Marland Kitchen atorvastatin (LIPITOR) 40 MG tablet Take 40 mg by mouth daily.     . clopidogrel (PLAVIX) 75 MG tablet Take 75 mg by mouth daily.     . Fluticasone-Salmeterol (ADVAIR DISKUS) 250-50 MCG/DOSE AEPB Inhale 1 puff into the lungs every 12 (twelve) hours.    . hydrochlorothiazide (MICROZIDE) 12.5 MG capsule Take 12.5 mg by mouth daily.     Marland Kitchen levothyroxine (SYNTHROID, LEVOTHROID) 88 MCG tablet Take 88 mcg by mouth daily.     . pantoprazole (PROTONIX) 40 MG tablet Take 40 mg by mouth at bedtime.  12  . polyethylene glycol (MIRALAX / GLYCOLAX) packet Take 17 g by mouth daily.    Marland Kitchen PRESCRIPTION MEDICATION 2 liters of Oxygen    . SPIRIVA HANDIHALER 18 MCG inhalation capsule Place 18 mcg into inhaler and inhale every morning.     Marland Kitchen ZETIA 10 MG tablet Take 10 mg by mouth daily.      Marland Kitchen ALPRAZolam (XANAX) 0.5 MG tablet Take 0.5 mg by mouth daily as needed.     Marland Kitchen oxyCODONE (ROXICODONE) 5 MG immediate release tablet Take 1 tablet (5 mg total) by mouth every 6 (six) hours as needed for pain. (Patient not taking: Reported on 06/10/2014) 30 tablet 0  . ranitidine (ZANTAC) 150 MG tablet Take 150 mg by mouth at bedtime.    Marland Kitchen Umeclidinium-Vilanterol (ANORO ELLIPTA) 62.5-25 MCG/INH AEPB Inhale into the lungs continuous as needed.     No current facility-administered medications for this visit.    Review of Systems : See HPI for pertinent positives and negatives.  Physical Examination  Filed Vitals:   06/10/14 1015 06/10/14 1018  BP: 120/78 100/70  Pulse: 92 92  Resp:  16  Height:  5' (1.524 m)  Weight:  139 lb (63.05 kg)  SpO2:  96%   Body mass index is 27.15 kg/(m^2).  General: WDWN female in NAD GAIT: normal Eyes: PERRLA Pulmonary: Non-labored, CTAB, Negative Rales, Negative rhonchi, & + expiratory wheezes in anterior fields.  Cardiac: regular Rhythm, no detected murmur.  VASCULAR EXAM Carotid Bruits Right Left   Negative Negative   Aorta is not palpable. Radial pulses are 2+ palpable and equal.     Gastrointestinal: soft, nontender, BS WNL, no r/g, negative masses palpated.  Musculoskeletal: Negative muscle atrophy/wasting. M/S 5/5 throughout, Extremities without ischemic changes.  Neurologic: A&O X 3; Appropriate Affect ; SENSATION ;normal;  Speech is normal CN 2-12 intact, Pain and light touch intact in extremities, Motor exam as listed above.         Non-Invasive Vascular Imaging CAROTID DUPLEX 06/10/2014   Right ICA: 50-69% stenosis. Left ICA: Patent left carotid endarterectomy site   Increased velocities in the right proximal, mid, and distal internal carotid artery compared to  10/14/13.  Assessment: Margaret Villarreal is a 74 y.o. female who is s/p left carotid endarterectomy performed on 09/05/2012. She has no history of stroke or TIA. Today's carotid Duplex suggests  50-69% right ICA stenosis and patent left carotid endarterectomy site. Increased velocities in the right proximal, mid, and distal internal carotid artery compared to 10/14/13.  Plan: Follow-up in 6 months with Carotid Duplex.    I discussed in depth with the patient the nature of atherosclerosis, and emphasized the importance of maximal medical management including strict control of blood pressure, blood glucose, and lipid levels, obtaining regular exercise, and continued cessation of smoking.  The patient is aware that without maximal medical management the underlying atherosclerotic  disease process will progress, limiting the benefit of any interventions. The patient was given information about stroke prevention and what symptoms should prompt the patient to seek immediate medical care. Thank you for allowing us to participate in this patient's care.  Charisse MarchSuzanne Zoya Sprecher, RN, MSN, FNP-C [facility] [place] Office: [phone]  Clinic Physician: Myra GianottiBrabham  06/10/2014 10:42 AM

## 2014-06-10 NOTE — Patient Instructions (Signed)
Stroke Prevention Some medical conditions and behaviors are associated with an increased chance of having a stroke. You may prevent a stroke by making healthy choices and managing medical conditions. HOW CAN I REDUCE MY RISK OF HAVING A STROKE?   Stay physically active. Get at least 30 minutes of activity on most or all days.  Do not smoke. It may also be helpful to avoid exposure to secondhand smoke.  Limit alcohol use. Moderate alcohol use is considered to be:  No more than 2 drinks per day for men.  No more than 1 drink per day for nonpregnant women.  Eat healthy foods. This involves:  Eating 5 or more servings of fruits and vegetables a day.  Making dietary changes that address high blood pressure (hypertension), high cholesterol, diabetes, or obesity.  Manage your cholesterol levels.  Making food choices that are high in fiber and low in saturated fat, trans fat, and cholesterol may control cholesterol levels.  Take any prescribed medicines to control cholesterol as directed by your health care provider.  Manage your diabetes.  Controlling your carbohydrate and sugar intake is recommended to manage diabetes.  Take any prescribed medicines to control diabetes as directed by your health care provider.  Control your hypertension.  Making food choices that are low in salt (sodium), saturated fat, trans fat, and cholesterol is recommended to manage hypertension.  Take any prescribed medicines to control hypertension as directed by your health care provider.  Maintain a healthy weight.  Reducing calorie intake and making food choices that are low in sodium, saturated fat, trans fat, and cholesterol are recommended to manage weight.  Stop drug abuse.  Avoid taking birth control pills.  Talk to your health care provider about the risks of taking birth control pills if you are over 35 years old, smoke, get migraines, or have ever had a blood clot.  Get evaluated for sleep  disorders (sleep apnea).  Talk to your health care provider about getting a sleep evaluation if you snore a lot or have excessive sleepiness.  Take medicines only as directed by your health care provider.  For some people, aspirin or blood thinners (anticoagulants) are helpful in reducing the risk of forming abnormal blood clots that can lead to stroke. If you have the irregular heart rhythm of atrial fibrillation, you should be on a blood thinner unless there is a good reason you cannot take them.  Understand all your medicine instructions.  Make sure that other conditions (such as anemia or atherosclerosis) are addressed. SEEK IMMEDIATE MEDICAL CARE IF:   You have sudden weakness or numbness of the face, arm, or leg, especially on one side of the body.  Your face or eyelid droops to one side.  You have sudden confusion.  You have trouble speaking (aphasia) or understanding.  You have sudden trouble seeing in one or both eyes.  You have sudden trouble walking.  You have dizziness.  You have a loss of balance or coordination.  You have a sudden, severe headache with no known cause.  You have new chest pain or an irregular heartbeat. Any of these symptoms may represent a serious problem that is an emergency. Do not wait to see if the symptoms will go away. Get medical help at once. Call your local emergency services (911 in [place] Do not drive yourself to the hospital. Document Released: 03/09/2004 Document Revised: 06/16/2013 Document Reviewed: 08/02/2012 Exit[employer] Patient Information 2015 [employer]. This information is not intended to replace advice given   to you by your health care provider. Make sure you discuss any questions you have with your health care provider.  

## 2014-08-06 IMAGING — CR DG CHEST 2V
2 series · 2 of 2 positions shown · non-contrast
Comparison: None.

CLINICAL DATA: Preoperative respiratory exam for left carotid
endarterectomy.

CHEST - 2 VIEW

[w chest pa]
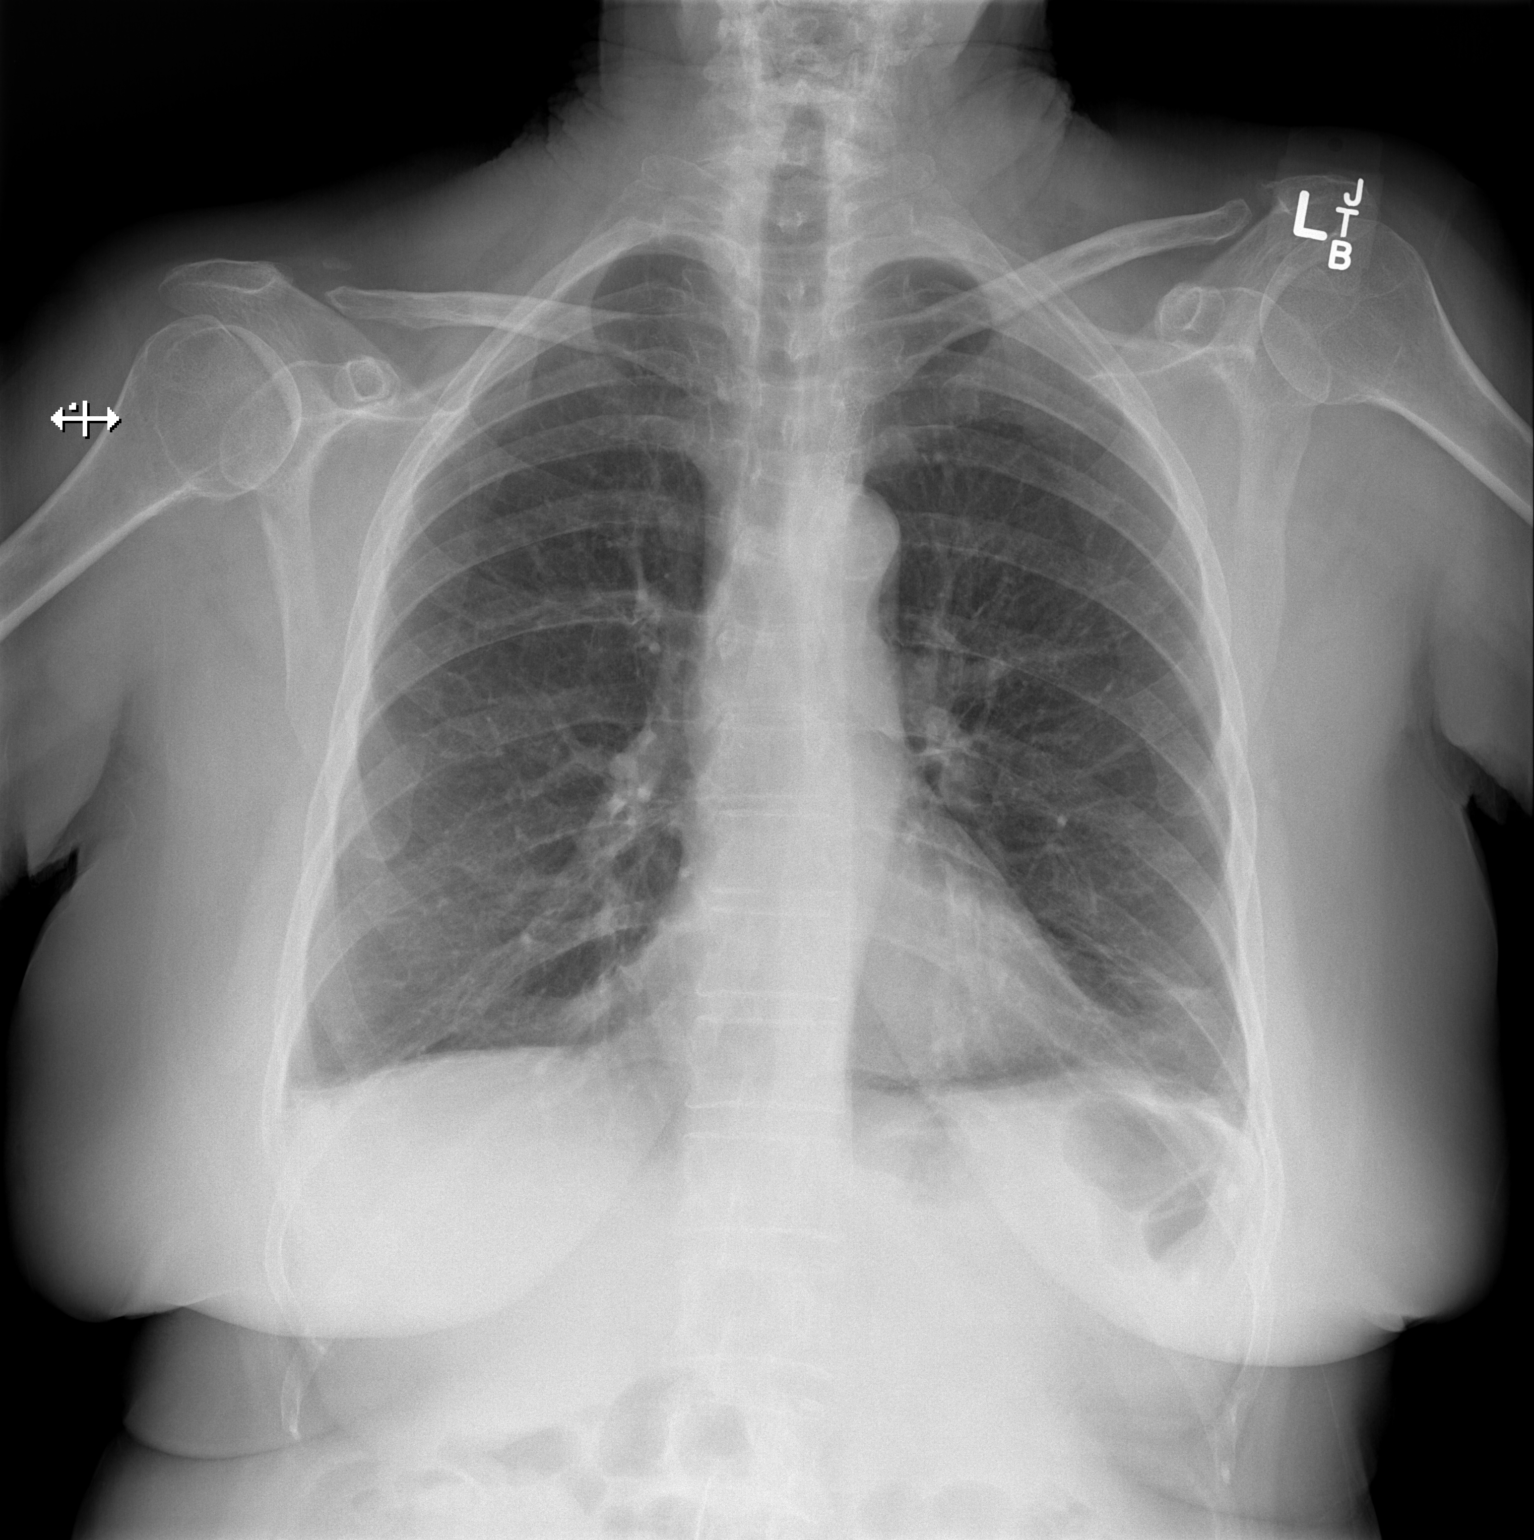

[w chest lat]
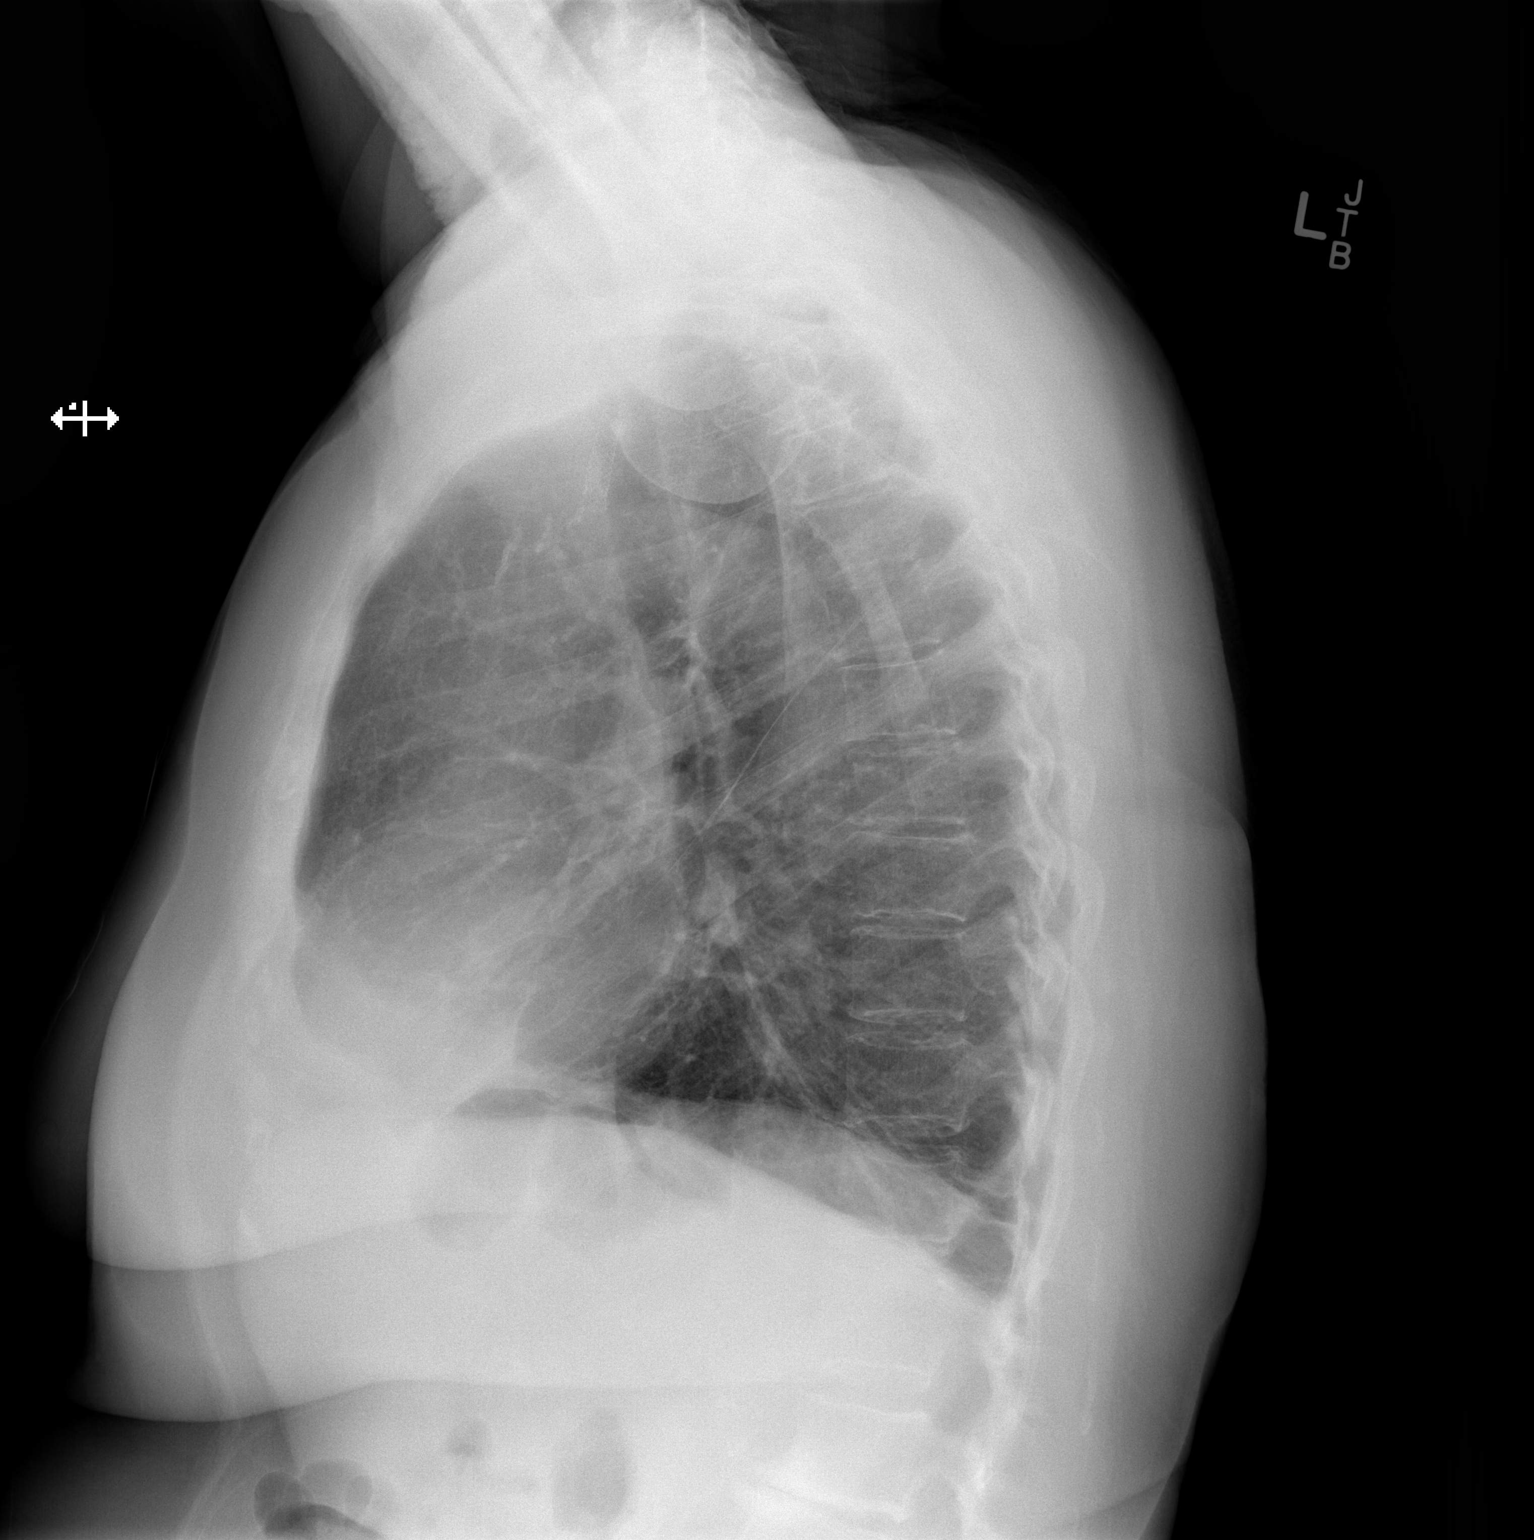

[2 of 2 positions shown; findings below may reference images not displayed]

FINDINGS: Lungs show scattered parenchymal scarring at the bases
bilaterally.  No evidence of pulmonary edema, airspace
consolidation, nodule or pleural fluid.  Heart size and mediastinal
contours are within normal limits.  Bony thorax is unremarkable.
IMPRESSION: No active disease.  Pulmonary scarring at both lung bases.

## 2014-12-09 ENCOUNTER — Encounter: Payer: Self-pay | Admitting: Family

## 2014-12-14 ENCOUNTER — Encounter (HOSPITAL_COMMUNITY): Payer: Commercial Managed Care - HMO

## 2014-12-14 ENCOUNTER — Ambulatory Visit: Payer: Commercial Managed Care - HMO | Admitting: Family

## 2014-12-21 ENCOUNTER — Encounter: Payer: Self-pay | Admitting: Family

## 2014-12-23 ENCOUNTER — Ambulatory Visit: Payer: Commercial Managed Care - HMO | Admitting: Family

## 2014-12-23 ENCOUNTER — Encounter (HOSPITAL_COMMUNITY): Payer: Commercial Managed Care - HMO

## 2015-02-26 ENCOUNTER — Encounter: Payer: Self-pay | Admitting: Family

## 2015-03-05 ENCOUNTER — Encounter (HOSPITAL_COMMUNITY): Payer: Commercial Managed Care - HMO

## 2015-03-05 ENCOUNTER — Ambulatory Visit: Payer: Commercial Managed Care - HMO | Admitting: Family

## 2015-04-02 ENCOUNTER — Encounter: Payer: Self-pay | Admitting: Family

## 2015-04-07 ENCOUNTER — Inpatient Hospital Stay (HOSPITAL_COMMUNITY)
Admission: RE | Admit: 2015-04-07 | Discharge: 2015-04-07 | Disposition: A | Discharge status: Home / Self Care | Payer: Commercial Managed Care - HMO | Source: Ambulatory Visit | Attending: Family | Admitting: Family

## 2015-04-07 ENCOUNTER — Ambulatory Visit: Payer: Commercial Managed Care - HMO | Admitting: Family

## 2015-04-07 DIAGNOSIS — I6521 Occlusion and stenosis of right carotid artery: Secondary | ICD-10-CM

## 2015-04-07 DIAGNOSIS — Z87891 Personal history of nicotine dependence: Secondary | ICD-10-CM

## 2015-04-07 DIAGNOSIS — Z48812 Encounter for surgical aftercare following surgery on the circulatory system: Secondary | ICD-10-CM

## 2016-04-26 DIAGNOSIS — J440 Chronic obstructive pulmonary disease with acute lower respiratory infection: Secondary | ICD-10-CM | POA: Diagnosis not present

## 2016-05-30 DIAGNOSIS — H2513 Age-related nuclear cataract, bilateral: Secondary | ICD-10-CM | POA: Diagnosis not present

## 2016-05-30 DIAGNOSIS — H5202 Hypermetropia, left eye: Secondary | ICD-10-CM | POA: Diagnosis not present

## 2016-07-26 DIAGNOSIS — H35033 Hypertensive retinopathy, bilateral: Secondary | ICD-10-CM | POA: Diagnosis not present

## 2016-07-26 DIAGNOSIS — H2512 Age-related nuclear cataract, left eye: Secondary | ICD-10-CM | POA: Diagnosis not present

## 2016-07-26 DIAGNOSIS — H25013 Cortical age-related cataract, bilateral: Secondary | ICD-10-CM | POA: Diagnosis not present

## 2016-07-26 DIAGNOSIS — H2513 Age-related nuclear cataract, bilateral: Secondary | ICD-10-CM | POA: Diagnosis not present

## 2016-07-27 DIAGNOSIS — E039 Hypothyroidism, unspecified: Secondary | ICD-10-CM | POA: Diagnosis not present

## 2016-07-27 DIAGNOSIS — I10 Essential (primary) hypertension: Secondary | ICD-10-CM | POA: Diagnosis not present

## 2016-07-27 DIAGNOSIS — Z1389 Encounter for screening for other disorder: Secondary | ICD-10-CM | POA: Diagnosis not present

## 2016-07-27 DIAGNOSIS — Z139 Encounter for screening, unspecified: Secondary | ICD-10-CM | POA: Diagnosis not present

## 2016-07-27 DIAGNOSIS — E785 Hyperlipidemia, unspecified: Secondary | ICD-10-CM | POA: Diagnosis not present

## 2016-07-27 DIAGNOSIS — D751 Secondary polycythemia: Secondary | ICD-10-CM | POA: Diagnosis not present

## 2016-07-27 DIAGNOSIS — Z9181 History of falling: Secondary | ICD-10-CM | POA: Diagnosis not present

## 2016-07-27 DIAGNOSIS — R7301 Impaired fasting glucose: Secondary | ICD-10-CM | POA: Diagnosis not present

## 2016-07-27 DIAGNOSIS — J449 Chronic obstructive pulmonary disease, unspecified: Secondary | ICD-10-CM | POA: Diagnosis not present

## 2016-07-27 DIAGNOSIS — Z23 Encounter for immunization: Secondary | ICD-10-CM | POA: Diagnosis not present

## 2016-08-03 DIAGNOSIS — R079 Chest pain, unspecified: Secondary | ICD-10-CM | POA: Diagnosis not present

## 2016-08-03 DIAGNOSIS — J449 Chronic obstructive pulmonary disease, unspecified: Secondary | ICD-10-CM | POA: Diagnosis not present

## 2016-08-03 DIAGNOSIS — E785 Hyperlipidemia, unspecified: Secondary | ICD-10-CM | POA: Diagnosis not present

## 2016-08-03 DIAGNOSIS — Z79899 Other long term (current) drug therapy: Secondary | ICD-10-CM | POA: Diagnosis not present

## 2016-08-03 DIAGNOSIS — K219 Gastro-esophageal reflux disease without esophagitis: Secondary | ICD-10-CM | POA: Diagnosis not present

## 2016-08-03 DIAGNOSIS — I200 Unstable angina: Secondary | ICD-10-CM | POA: Diagnosis not present

## 2016-08-03 DIAGNOSIS — I10 Essential (primary) hypertension: Secondary | ICD-10-CM | POA: Diagnosis not present

## 2016-08-03 DIAGNOSIS — E039 Hypothyroidism, unspecified: Secondary | ICD-10-CM | POA: Diagnosis not present

## 2016-08-03 DIAGNOSIS — Z7982 Long term (current) use of aspirin: Secondary | ICD-10-CM | POA: Diagnosis not present

## 2016-08-04 DIAGNOSIS — K219 Gastro-esophageal reflux disease without esophagitis: Secondary | ICD-10-CM | POA: Diagnosis not present

## 2016-08-04 DIAGNOSIS — E785 Hyperlipidemia, unspecified: Secondary | ICD-10-CM | POA: Diagnosis not present

## 2016-08-04 DIAGNOSIS — J449 Chronic obstructive pulmonary disease, unspecified: Secondary | ICD-10-CM | POA: Diagnosis not present

## 2016-08-04 DIAGNOSIS — I10 Essential (primary) hypertension: Secondary | ICD-10-CM | POA: Diagnosis not present

## 2016-08-04 DIAGNOSIS — R079 Chest pain, unspecified: Secondary | ICD-10-CM | POA: Diagnosis not present

## 2016-08-04 DIAGNOSIS — E039 Hypothyroidism, unspecified: Secondary | ICD-10-CM | POA: Diagnosis not present

## 2016-08-09 DIAGNOSIS — E785 Hyperlipidemia, unspecified: Secondary | ICD-10-CM | POA: Diagnosis not present

## 2016-08-09 DIAGNOSIS — J449 Chronic obstructive pulmonary disease, unspecified: Secondary | ICD-10-CM | POA: Diagnosis not present

## 2016-08-09 DIAGNOSIS — K219 Gastro-esophageal reflux disease without esophagitis: Secondary | ICD-10-CM | POA: Diagnosis not present

## 2016-08-09 DIAGNOSIS — R079 Chest pain, unspecified: Secondary | ICD-10-CM | POA: Diagnosis not present

## 2016-08-09 DIAGNOSIS — I10 Essential (primary) hypertension: Secondary | ICD-10-CM | POA: Diagnosis not present

## 2016-08-21 DIAGNOSIS — M8589 Other specified disorders of bone density and structure, multiple sites: Secondary | ICD-10-CM | POA: Diagnosis not present

## 2016-08-21 DIAGNOSIS — Z1231 Encounter for screening mammogram for malignant neoplasm of breast: Secondary | ICD-10-CM | POA: Diagnosis not present

## 2016-10-30 DIAGNOSIS — E785 Hyperlipidemia, unspecified: Secondary | ICD-10-CM | POA: Diagnosis not present

## 2016-10-30 DIAGNOSIS — Z139 Encounter for screening, unspecified: Secondary | ICD-10-CM | POA: Diagnosis not present

## 2016-10-30 DIAGNOSIS — Z0000 Encounter for general adult medical examination without abnormal findings: Secondary | ICD-10-CM | POA: Diagnosis not present

## 2016-10-30 DIAGNOSIS — Z23 Encounter for immunization: Secondary | ICD-10-CM | POA: Diagnosis not present

## 2016-10-30 DIAGNOSIS — Z1211 Encounter for screening for malignant neoplasm of colon: Secondary | ICD-10-CM | POA: Diagnosis not present

## 2016-10-30 DIAGNOSIS — Z136 Encounter for screening for cardiovascular disorders: Secondary | ICD-10-CM | POA: Diagnosis not present

## 2016-10-30 DIAGNOSIS — Z1389 Encounter for screening for other disorder: Secondary | ICD-10-CM | POA: Diagnosis not present

## 2016-10-30 DIAGNOSIS — Z9181 History of falling: Secondary | ICD-10-CM | POA: Diagnosis not present

## 2017-01-27 DIAGNOSIS — Z23 Encounter for immunization: Secondary | ICD-10-CM | POA: Diagnosis not present

## 2017-01-27 DIAGNOSIS — E039 Hypothyroidism, unspecified: Secondary | ICD-10-CM | POA: Diagnosis not present

## 2017-01-27 DIAGNOSIS — J449 Chronic obstructive pulmonary disease, unspecified: Secondary | ICD-10-CM | POA: Diagnosis not present

## 2017-01-27 DIAGNOSIS — E785 Hyperlipidemia, unspecified: Secondary | ICD-10-CM | POA: Diagnosis not present

## 2017-01-27 DIAGNOSIS — R7301 Impaired fasting glucose: Secondary | ICD-10-CM | POA: Diagnosis not present

## 2017-01-27 DIAGNOSIS — D751 Secondary polycythemia: Secondary | ICD-10-CM | POA: Diagnosis not present

## 2017-01-27 DIAGNOSIS — I10 Essential (primary) hypertension: Secondary | ICD-10-CM | POA: Diagnosis not present

## 2017-03-04 DIAGNOSIS — Z87891 Personal history of nicotine dependence: Secondary | ICD-10-CM | POA: Diagnosis not present

## 2017-03-04 DIAGNOSIS — R0602 Shortness of breath: Secondary | ICD-10-CM | POA: Diagnosis not present

## 2017-03-04 DIAGNOSIS — J4521 Mild intermittent asthma with (acute) exacerbation: Secondary | ICD-10-CM | POA: Diagnosis not present

## 2017-08-04 DIAGNOSIS — J449 Chronic obstructive pulmonary disease, unspecified: Secondary | ICD-10-CM | POA: Diagnosis not present

## 2017-08-04 DIAGNOSIS — E785 Hyperlipidemia, unspecified: Secondary | ICD-10-CM | POA: Diagnosis not present

## 2017-08-04 DIAGNOSIS — D751 Secondary polycythemia: Secondary | ICD-10-CM | POA: Diagnosis not present

## 2017-08-04 DIAGNOSIS — E039 Hypothyroidism, unspecified: Secondary | ICD-10-CM | POA: Diagnosis not present

## 2017-08-04 DIAGNOSIS — I10 Essential (primary) hypertension: Secondary | ICD-10-CM | POA: Diagnosis not present

## 2017-08-04 DIAGNOSIS — R7301 Impaired fasting glucose: Secondary | ICD-10-CM | POA: Diagnosis not present

## 2017-10-23 DIAGNOSIS — E785 Hyperlipidemia, unspecified: Secondary | ICD-10-CM | POA: Diagnosis not present

## 2017-10-23 DIAGNOSIS — J449 Chronic obstructive pulmonary disease, unspecified: Secondary | ICD-10-CM | POA: Diagnosis not present

## 2017-10-23 DIAGNOSIS — R61 Generalized hyperhidrosis: Secondary | ICD-10-CM | POA: Diagnosis not present

## 2017-10-23 DIAGNOSIS — R52 Pain, unspecified: Secondary | ICD-10-CM | POA: Diagnosis not present

## 2017-10-23 DIAGNOSIS — I7090 Unspecified atherosclerosis: Secondary | ICD-10-CM

## 2017-10-23 DIAGNOSIS — Z7952 Long term (current) use of systemic steroids: Secondary | ICD-10-CM | POA: Diagnosis not present

## 2017-10-23 DIAGNOSIS — R079 Chest pain, unspecified: Secondary | ICD-10-CM | POA: Diagnosis not present

## 2017-10-23 DIAGNOSIS — R0602 Shortness of breath: Secondary | ICD-10-CM | POA: Diagnosis not present

## 2017-10-23 DIAGNOSIS — Z7982 Long term (current) use of aspirin: Secondary | ICD-10-CM | POA: Diagnosis not present

## 2017-10-23 DIAGNOSIS — J9811 Atelectasis: Secondary | ICD-10-CM | POA: Diagnosis not present

## 2017-10-23 DIAGNOSIS — E039 Hypothyroidism, unspecified: Secondary | ICD-10-CM | POA: Diagnosis not present

## 2017-10-23 DIAGNOSIS — I2510 Atherosclerotic heart disease of native coronary artery without angina pectoris: Secondary | ICD-10-CM | POA: Diagnosis not present

## 2017-10-23 DIAGNOSIS — K219 Gastro-esophageal reflux disease without esophagitis: Secondary | ICD-10-CM | POA: Diagnosis not present

## 2017-10-23 DIAGNOSIS — Z87891 Personal history of nicotine dependence: Secondary | ICD-10-CM | POA: Diagnosis not present

## 2017-10-23 DIAGNOSIS — I10 Essential (primary) hypertension: Secondary | ICD-10-CM | POA: Diagnosis not present

## 2017-10-23 DIAGNOSIS — M1990 Unspecified osteoarthritis, unspecified site: Secondary | ICD-10-CM | POA: Diagnosis not present

## 2017-10-23 DIAGNOSIS — Z7902 Long term (current) use of antithrombotics/antiplatelets: Secondary | ICD-10-CM | POA: Diagnosis not present

## 2017-10-23 DIAGNOSIS — Z79899 Other long term (current) drug therapy: Secondary | ICD-10-CM | POA: Diagnosis not present

## 2017-10-23 DIAGNOSIS — R1084 Generalized abdominal pain: Secondary | ICD-10-CM | POA: Diagnosis not present

## 2017-10-27 DIAGNOSIS — E785 Hyperlipidemia, unspecified: Secondary | ICD-10-CM | POA: Diagnosis not present

## 2017-10-27 DIAGNOSIS — R079 Chest pain, unspecified: Secondary | ICD-10-CM | POA: Diagnosis not present

## 2017-10-27 DIAGNOSIS — J449 Chronic obstructive pulmonary disease, unspecified: Secondary | ICD-10-CM | POA: Diagnosis not present

## 2017-10-27 DIAGNOSIS — Z79899 Other long term (current) drug therapy: Secondary | ICD-10-CM | POA: Diagnosis not present

## 2017-11-15 ENCOUNTER — Ambulatory Visit (INDEPENDENT_AMBULATORY_CARE_PROVIDER_SITE_OTHER): Payer: Medicare Other | Admitting: Cardiology

## 2017-11-15 ENCOUNTER — Encounter: Payer: Self-pay | Admitting: Cardiology

## 2017-11-15 VITALS — BP 128/70 | HR 82 | Ht 60.0 in | Wt 132.0 lb

## 2017-11-15 DIAGNOSIS — I2510 Atherosclerotic heart disease of native coronary artery without angina pectoris: Secondary | ICD-10-CM | POA: Diagnosis not present

## 2017-11-15 DIAGNOSIS — E782 Mixed hyperlipidemia: Secondary | ICD-10-CM | POA: Diagnosis not present

## 2017-11-15 DIAGNOSIS — K449 Diaphragmatic hernia without obstruction or gangrene: Secondary | ICD-10-CM | POA: Diagnosis not present

## 2017-11-15 DIAGNOSIS — J449 Chronic obstructive pulmonary disease, unspecified: Secondary | ICD-10-CM | POA: Insufficient documentation

## 2017-11-15 DIAGNOSIS — E039 Hypothyroidism, unspecified: Secondary | ICD-10-CM

## 2017-11-15 DIAGNOSIS — E785 Hyperlipidemia, unspecified: Secondary | ICD-10-CM | POA: Insufficient documentation

## 2017-11-15 MED ORDER — NITROGLYCERIN 0.4 MG SL SUBL: 0.4000 mg | SUBLINGUAL_TABLET | SUBLINGUAL | 3 refills | Status: DC | PRN

## 2017-11-15 NOTE — Patient Instructions (Signed)
Medication Instructions:  Your physician has recommended you make the following change in your medication:   START: Nitroglycerin 0.4 mg sublingual tablet every 5 minutes PRN    Labwork: None  Testing/Procedures: None  Follow-Up: Your physician recommends that you schedule a follow-up appointment in: 6 months   Any Other Special Instructions Will Be Listed Below (If Applicable).     If you need a refill on your cardiac medications before your next appointment, please call your pharmacy.

## 2017-11-15 NOTE — Progress Notes (Signed)
Cardiology Office Note:    Date:  11/15/2017   ID:  Margaret Villarreal, DOB Mar 01, 1940, MRN [id number]  PCP:  Paulina Fusi, MD  Cardiologist:  Garwin Brothers, MD   Referring MD: Paulina Fusi, MD    ASSESSMENT:    1. Chronic obstructive pulmonary disease, unspecified COPD type (HCC)   2. Hypothyroidism, unspecified type   3. Mixed hyperlipidemia   4. Hiatal hernia   5. Coronary artery disease involving native coronary artery of native heart without angina pectoris    PLAN:    In order of problems listed above:  1. Secondary prevention stressed with the patient.  Importance of compliance with diet and medication stressed and she vocalized understanding.  Her blood pressure is stable.  Diet was discussed for dyslipidemia.  Lipids are followed by her primary care physician. 2. She appears clinically stable from a cardiovascular standpoint.  She is asymptomatic.  We will continue her current medications. 3. Sublingual nitroglycerin prescription was sent, its protocol and 911 protocol explained and the patient vocalized understanding questions were answered to the patient's satisfaction 4. Patient will be seen in follow-up appointment in 6 months or earlier if the patient has any concerns    Medication Adjustments/Labs and Tests Ordered: Current medicines are reviewed at length with the patient today.  Concerns regarding medicines are outlined above.  No orders of the defined types were placed in this encounter.  No orders of the defined types were placed in this encounter.    History of Present Illness:    Margaret Villarreal is a 77 y.o. female who is being seen today for the evaluation of coronary artery disease at the request of Paulina Fusi, MD.  Patient is a pleasant 77 year old female.  She is previously unknown to me.  She was at [facility] hospital for chest pain and was treated and evaluated.  Stress test was unremarkable and she was released.  Since hospital  discharge she is doing fine.  She takes care of activities of daily living without any problems.  She is a very active woman.  Her son is accompanying her for this visit and is very supportive.  At the time of my evaluation, the patient is alert awake oriented and in no distress.  Past Medical History:  Diagnosis Date  . Anxiety   . Arthritis    hands  . Bronchitis   . Carotid artery occlusion    70% Left side and bilateral bruit  . Constipation   . COPD (chronic obstructive pulmonary disease) (HCC)   . Esophageal reflux   . GERD (gastroesophageal reflux disease)   . History of oxygen administration    2l At bedtime  . Hyperlipidemia   . Hyperlipidemia   . Hypertension   . Hypothyroidism   . Obstructive chronic bronchitis with acute bronchitis (HCC)   . Osteopenia   . Personal history of kidney stones   . Polycythemia, secondary   . Shortness of breath    with exertion  . Thyroid disease    Hypo-Thyroidism    Past Surgical History:  Procedure Laterality Date  . Bone density  05/17/12  . CAROTID ENDARTERECTOMY Left 09-05-12   CE  . CESAREAN SECTION  11/20/80  . DIAGNOSTIC MAMMOGRAM  04/01/11  . ENDARTERECTOMY Left 09/05/2012   Procedure: ENDARTERECTOMY CAROTID with patch angioplasty;  Surgeon: Pryor Ochoa, MD;  Location: [facility] OR;  Service: Vascular;  Laterality: Left;  . SUBCLAVIAN ARTERY STENT    . TONSILLECTOMY  Current Medications: Current Meds  Medication Sig  . albuterol (PROVENTIL HFA;VENTOLIN HFA) 108 (90 BASE) MCG/ACT inhaler Inhale 2 puffs into the lungs every 6 (six) hours as needed for wheezing.  Marland Kitchen ALPRAZolam (XANAX) 0.5 MG tablet Take 0.5 mg by mouth daily as needed.   Marland Kitchen aspirin EC 81 MG tablet Take 81 mg by mouth daily.  Marland Kitchen atorvastatin (LIPITOR) 40 MG tablet Take 40 mg by mouth daily.   . clopidogrel (PLAVIX) 75 MG tablet Take 75 mg by mouth daily.   . Fluticasone-Salmeterol (ADVAIR DISKUS) 250-50 MCG/DOSE AEPB Inhale 1 puff into the lungs every 12  (twelve) hours.  Marland Kitchen levothyroxine (SYNTHROID, LEVOTHROID) 88 MCG tablet Take 88 mcg by mouth daily.   . pantoprazole (PROTONIX) 40 MG tablet Take 40 mg by mouth at bedtime.  . polyethylene glycol (MIRALAX / GLYCOLAX) packet Take 17 g by mouth daily.  Marland Kitchen PRESCRIPTION MEDICATION 2 liters of Oxygen  . SPIRIVA HANDIHALER 18 MCG inhalation capsule Place 18 mcg into inhaler and inhale every morning.   Marland Kitchen Umeclidinium-Vilanterol (ANORO ELLIPTA) 62.5-25 MCG/INH AEPB Inhale into the lungs continuous as needed.  Marland Kitchen ZETIA 10 MG tablet Take 10 mg by mouth daily.      Allergies:   Omnipaque [iohexol]; Pepto-bismol [bismuth subsalicylate]; and Hydrogen peroxide   Social History   Socioeconomic History  . Marital status: Widowed    Spouse name: Not on file  . Number of children: Not on file  . Years of education: Not on file  . Highest education level: Not on file  Occupational History  . Not on file  Social Needs  . Financial resource strain: Not on file  . Food insecurity:    Worry: Not on file    Inability: Not on file  . Transportation needs:    Medical: Not on file    Non-medical: Not on file  Tobacco Use  . Smoking status: Former Smoker    Packs/day: 1.00    Years: 54.00    Pack years: 54.00    Types: Cigarettes    Last attempt to quit: 09/04/2012    Years since quitting: 5.2  . Smokeless tobacco: Never Used  Substance and Sexual Activity  . Alcohol use: No  . Drug use: No  . Sexual activity: Not on file  Lifestyle  . Physical activity:    Days per week: Not on file    Minutes per session: Not on file  . Stress: Not on file  Relationships  . Social connections:    Talks on phone: Not on file    Gets together: Not on file    Attends religious service: Not on file    Active member of club or organization: Not on file    Attends meetings of clubs or organizations: Not on file    Relationship status: Not on file  Other Topics Concern  . Not on file  Social History Narrative    . Not on file     Family History: The patient's family history includes Arthritis in her father, mother, and sister; Asthma in her mother; Cancer in her mother and sister; Crohn's disease in her sister; Deep vein thrombosis in her mother; Diabetes in her brother, mother, sister, and son; Heart attack in her father, mother, and sister; Heart disease in her father, mother, and sister; Hyperlipidemia in her sister; Thyroid disease in her mother and sister; Varicose Veins in her mother and sister.  ROS:   Please see the history of present illness.  All other systems reviewed and are negative.  EKGs/Labs/Other Studies Reviewed:    The following studies were reviewed today: I reviewed [facility] hospital records extensively and discussed with questions were answered to her satisfaction.   Recent Labs: No results found for requested labs within last 8760 hours.  Recent Lipid Panel No results found for: CHOL, TRIG, HDL, CHOLHDL, VLDL, LDLCALC, LDLDIRECT  Physical Exam:    VS:  BP 128/70 (BP Location: Right Arm, Patient Position: Sitting, Cuff Size: Normal)   Pulse 82   Ht 5' (1.524 m)   Wt 132 lb (59.9 kg)   SpO2 97%   BMI 25.78 kg/m     Wt Readings from Last 3 Encounters:  11/15/17 132 lb (59.9 kg)  06/10/14 139 lb (63 kg)  10/14/13 147 lb (66.7 kg)     GEN: Patient is in no acute distress HEENT: Normal NECK: No JVD; No carotid bruits LYMPHATICS: No lymphadenopathy CARDIAC: S1 S2 regular, 2/6 systolic murmur at the apex. RESPIRATORY:  Clear to auscultation without rales, wheezing or rhonchi  ABDOMEN: Soft, non-tender, non-distended MUSCULOSKELETAL:  No edema; No deformity  SKIN: Warm and dry NEUROLOGIC:  Alert and oriented x 3 PSYCHIATRIC:  Normal affect    Signed, Garwin Brothers, MD  11/15/2017 11:56 AM    [facility]

## 2018-02-02 DIAGNOSIS — R7301 Impaired fasting glucose: Secondary | ICD-10-CM | POA: Diagnosis not present

## 2018-02-02 DIAGNOSIS — I10 Essential (primary) hypertension: Secondary | ICD-10-CM | POA: Diagnosis not present

## 2018-02-02 DIAGNOSIS — D751 Secondary polycythemia: Secondary | ICD-10-CM | POA: Diagnosis not present

## 2018-02-02 DIAGNOSIS — Z23 Encounter for immunization: Secondary | ICD-10-CM | POA: Diagnosis not present

## 2018-02-02 DIAGNOSIS — E039 Hypothyroidism, unspecified: Secondary | ICD-10-CM | POA: Diagnosis not present

## 2018-02-02 DIAGNOSIS — J449 Chronic obstructive pulmonary disease, unspecified: Secondary | ICD-10-CM | POA: Diagnosis not present

## 2018-02-02 DIAGNOSIS — E785 Hyperlipidemia, unspecified: Secondary | ICD-10-CM | POA: Diagnosis not present

## 2018-03-02 DIAGNOSIS — Z1231 Encounter for screening mammogram for malignant neoplasm of breast: Secondary | ICD-10-CM | POA: Diagnosis not present

## 2018-06-06 DIAGNOSIS — Z139 Encounter for screening, unspecified: Secondary | ICD-10-CM | POA: Diagnosis not present

## 2018-06-06 DIAGNOSIS — S61411A Laceration without foreign body of right hand, initial encounter: Secondary | ICD-10-CM | POA: Diagnosis not present

## 2018-08-03 DIAGNOSIS — I10 Essential (primary) hypertension: Secondary | ICD-10-CM | POA: Diagnosis not present

## 2018-08-03 DIAGNOSIS — R7301 Impaired fasting glucose: Secondary | ICD-10-CM | POA: Diagnosis not present

## 2018-08-03 DIAGNOSIS — E785 Hyperlipidemia, unspecified: Secondary | ICD-10-CM | POA: Diagnosis not present

## 2018-08-03 DIAGNOSIS — D751 Secondary polycythemia: Secondary | ICD-10-CM | POA: Diagnosis not present

## 2018-08-03 DIAGNOSIS — E039 Hypothyroidism, unspecified: Secondary | ICD-10-CM | POA: Diagnosis not present

## 2018-08-03 DIAGNOSIS — J449 Chronic obstructive pulmonary disease, unspecified: Secondary | ICD-10-CM | POA: Diagnosis not present

## 2018-09-05 DIAGNOSIS — Z9181 History of falling: Secondary | ICD-10-CM | POA: Diagnosis not present

## 2018-09-05 DIAGNOSIS — E785 Hyperlipidemia, unspecified: Secondary | ICD-10-CM | POA: Diagnosis not present

## 2018-09-05 DIAGNOSIS — Z0000 Encounter for general adult medical examination without abnormal findings: Secondary | ICD-10-CM | POA: Diagnosis not present

## 2018-09-05 DIAGNOSIS — N959 Unspecified menopausal and perimenopausal disorder: Secondary | ICD-10-CM | POA: Diagnosis not present

## 2018-12-30 DIAGNOSIS — I10 Essential (primary) hypertension: Secondary | ICD-10-CM | POA: Diagnosis not present

## 2018-12-30 DIAGNOSIS — H8113 Benign paroxysmal vertigo, bilateral: Secondary | ICD-10-CM | POA: Diagnosis not present

## 2019-01-28 ENCOUNTER — Other Ambulatory Visit: Payer: Self-pay | Admitting: Cardiology

## 2019-02-01 DIAGNOSIS — E039 Hypothyroidism, unspecified: Secondary | ICD-10-CM | POA: Diagnosis not present

## 2019-02-01 DIAGNOSIS — E785 Hyperlipidemia, unspecified: Secondary | ICD-10-CM | POA: Diagnosis not present

## 2019-02-01 DIAGNOSIS — R7301 Impaired fasting glucose: Secondary | ICD-10-CM | POA: Diagnosis not present

## 2019-02-01 DIAGNOSIS — D751 Secondary polycythemia: Secondary | ICD-10-CM | POA: Diagnosis not present

## 2019-02-18 ENCOUNTER — Other Ambulatory Visit: Payer: Self-pay

## 2019-02-18 ENCOUNTER — Ambulatory Visit (INDEPENDENT_AMBULATORY_CARE_PROVIDER_SITE_OTHER): Payer: Medicare Other | Admitting: Cardiology

## 2019-02-18 ENCOUNTER — Encounter: Payer: Self-pay | Admitting: Cardiology

## 2019-02-18 VITALS — BP 150/90 | HR 82 | Ht 59.0 in | Wt 133.0 lb

## 2019-02-18 DIAGNOSIS — I2510 Atherosclerotic heart disease of native coronary artery without angina pectoris: Secondary | ICD-10-CM | POA: Diagnosis not present

## 2019-02-18 DIAGNOSIS — I10 Essential (primary) hypertension: Secondary | ICD-10-CM | POA: Diagnosis not present

## 2019-02-18 DIAGNOSIS — E782 Mixed hyperlipidemia: Secondary | ICD-10-CM | POA: Diagnosis not present

## 2019-02-18 NOTE — Progress Notes (Signed)
Cardiology Office Note:    Date:  02/18/2019   ID:  Margaret Villarreal, DOB 12-19-1940, MRN [id number]  PCP:  Paulina Fusi, MD  Cardiologist:  Garwin Brothers, MD   Referring MD: Paulina Fusi, MD    ASSESSMENT:    1. Coronary artery disease involving native coronary artery of native heart without angina pectoris   2. Mixed hyperlipidemia   3. Essential hypertension    PLAN:    In order of problems listed above:  1. Coronary artery disease: Secondary prevention stressed with the patient.  Importance of compliance with diet and medication stressed and she vocalized understanding.  I urged her to walk half an hour at least every day and she promises to do so.  This will be at least 5 days a week. 2. Essential hypertension: Blood pressure is stable: She recently had blood work done by her primary care physician.  She was also advised about being watchful about her salt intake and dietary restrictions and lifestyle modifications by primary care physician and she promises to do so.  She will document her blood pressure once a day and send it to me in a week this will be home readings. 3. Mixed dyslipidemia: Lipids followed by primary care physician and work found to be stable according to the patient in the month of December. 4. Patient will be seen in follow-up appointment in 6 months or earlier if the patient has any concerns    Medication Adjustments/Labs and Tests Ordered: Current medicines are reviewed at length with the patient today.  Concerns regarding medicines are outlined above.  No orders of the defined types were placed in this encounter.  No orders of the defined types were placed in this encounter.    Chief Complaint  Patient presents with  . Follow-up    6 months     History of Present Illness:    Margaret Villarreal is a 79 y.o. female.  Patient has known coronary artery disease.  She denies any problems at this time and takes care of activities of daily  living.  No chest pain orthopnea or PND.  She walks on a regular basis.  She has lived a sedentary lifestyle because of the pandemic but this is getting better.  She has not used nitroglycerin at all in the past several months.  At the time of my evaluation, the patient is alert awake oriented and in no distress.  Past Medical History:  Diagnosis Date  . Anxiety   . Arthritis    hands  . Bronchitis   . Carotid artery occlusion    70% Left side and bilateral bruit  . Constipation   . COPD (chronic obstructive pulmonary disease) (HCC)   . Esophageal reflux   . GERD (gastroesophageal reflux disease)   . History of oxygen administration    2l At bedtime  . Hyperlipidemia   . Hyperlipidemia   . Hypertension   . Hypothyroidism   . Obstructive chronic bronchitis with acute bronchitis (HCC)   . Osteopenia   . Personal history of kidney stones   . Polycythemia, secondary   . Shortness of breath    with exertion  . Thyroid disease    Hypo-Thyroidism    Past Surgical History:  Procedure Laterality Date  . Bone density  05/17/12  . CAROTID ENDARTERECTOMY Left 09-05-12   CE  . CESAREAN SECTION  11/20/80  . DIAGNOSTIC MAMMOGRAM  04/01/11  . ENDARTERECTOMY Left 09/05/2012   Procedure: ENDARTERECTOMY  CAROTID with patch angioplasty;  Surgeon: Pryor Ochoa, MD;  [facility];  Service: Vascular;  Laterality: Left;  . SUBCLAVIAN ARTERY STENT    . TONSILLECTOMY      Current Medications: Current Meds  Medication Sig  . albuterol (PROVENTIL HFA;VENTOLIN HFA) 108 (90 BASE) MCG/ACT inhaler Inhale 2 puffs into the lungs every 6 (six) hours as needed for wheezing.  Marland Kitchen ALPRAZolam (XANAX) 0.5 MG tablet Take 0.5 mg by mouth daily as needed.   Marland Kitchen amLODipine (NORVASC) 5 MG tablet Take 5 mg by mouth daily.  Marland Kitchen aspirin EC 81 MG tablet Take 81 mg by mouth daily.  Marland Kitchen atorvastatin (LIPITOR) 40 MG tablet Take 40 mg by mouth daily.   . clopidogrel (PLAVIX) 75 MG tablet Take 75 mg by mouth daily.   .  Fluticasone-Salmeterol (ADVAIR DISKUS) 250-50 MCG/DOSE AEPB Inhale 1 puff into the lungs every 12 (twelve) hours.  Marland Kitchen levothyroxine (SYNTHROID, LEVOTHROID) 88 MCG tablet Take 88 mcg by mouth daily.   . meclizine (ANTIVERT) 25 MG tablet Take 25 mg by mouth 3 (three) times daily as needed.  . nitroGLYCERIN (NITROSTAT) 0.4 MG SL tablet PLACE 1 TABLET UNDER THE TONGUE EVERY 5 MINUTES AS NEEDED FOR CHEST PAIN.  Marland Kitchen pantoprazole (PROTONIX) 40 MG tablet Take 40 mg by mouth at bedtime.  . polyethylene glycol (MIRALAX / GLYCOLAX) packet Take 17 g by mouth daily.  Marland Kitchen PRESCRIPTION MEDICATION 2 liters of Oxygen  . SPIRIVA HANDIHALER 18 MCG inhalation capsule Place 18 mcg into inhaler and inhale every morning.   Marland Kitchen Umeclidinium-Vilanterol (ANORO ELLIPTA) 62.5-25 MCG/INH AEPB Inhale into the lungs continuous as needed.  Marland Kitchen ZETIA 10 MG tablet Take 10 mg by mouth daily.      Allergies:   Omnipaque [iohexol], Pepto-bismol [bismuth subsalicylate], and Hydrogen peroxide   Social History   Socioeconomic History  . Marital status: Widowed    Spouse name: Not on file  . Number of children: Not on file  . Years of education: Not on file  . Highest education level: Not on file  Occupational History  . Not on file  Tobacco Use  . Smoking status: Former Smoker    Packs/day: 1.00    Years: 54.00    Pack years: 54.00    Types: Cigarettes    Quit date: 09/04/2012    Years since quitting: 6.4  . Smokeless tobacco: Never Used  Substance and Sexual Activity  . Alcohol use: No  . Drug use: No  . Sexual activity: Not on file  Other Topics Concern  . Not on file  Social History Narrative  . Not on file   Social Determinants of Health   Financial Resource Strain:   . Difficulty of Paying Living Expenses: Not on file  Food Insecurity:   . Worried About Programme researcher, broadcasting/film/video in the Last Year: Not on file  . Ran Out of Food in the Last Year: Not on file  Transportation Needs:   . Lack of Transportation (Medical):  Not on file  . Lack of Transportation (Non-Medical): Not on file  Physical Activity:   . Days of Exercise per Week: Not on file  . Minutes of Exercise per Session: Not on file  Stress:   . Feeling of Stress : Not on file  Social Connections:   . Frequency of Communication with Friends and Family: Not on file  . Frequency of Social Gatherings with Friends and Family: Not on file  . Attends Religious Services: Not on file  .  Active Member of Clubs or Organizations: Not on file  . Attends Archivist Meetings: Not on file  . Marital Status: Not on file     Family History: The patient's family history includes Arthritis in her father, mother, and sister; Asthma in her mother; Cancer in her mother and sister; Crohn's disease in her sister; Deep vein thrombosis in her mother; Diabetes in her brother, mother, sister, and son; Heart attack in her father, mother, and sister; Heart disease in her father, mother, and sister; Hyperlipidemia in her sister; Thyroid disease in her mother and sister; Varicose Veins in her mother and sister.  ROS:   Please see the history of present illness.    All other systems reviewed and are negative.  EKGs/Labs/Other Studies Reviewed:    The following studies were reviewed today: I discussed my findings with the patient at extensive length   Recent Labs: No results found for requested labs within last 8760 hours.  Recent Lipid Panel No results found for: CHOL, TRIG, HDL, CHOLHDL, VLDL, LDLCALC, LDLDIRECT  Physical Exam:    VS:  BP (!) 150/90   Pulse 82   Ht 4\' 11"  (1.499 m)   Wt 133 lb (60.3 kg)   SpO2 92%   BMI 26.86 kg/m     Wt Readings from Last 3 Encounters:  02/18/19 133 lb (60.3 kg)  11/15/17 132 lb (59.9 kg)  06/10/14 139 lb (63 kg)     GEN: Patient is in no acute distress HEENT: Normal NECK: No JVD; No carotid bruits LYMPHATICS: No lymphadenopathy CARDIAC: Hear sounds regular, 2/6 systolic murmur at the apex. RESPIRATORY:   Clear to auscultation without rales, wheezing or rhonchi  ABDOMEN: Soft, non-tender, non-distended MUSCULOSKELETAL:  No edema; No deformity  SKIN: Warm and dry NEUROLOGIC:  Alert and oriented x 3 PSYCHIATRIC:  Normal affect   Signed, Jenean Lindau, MD  02/18/2019 4:11 PM    [facility]

## 2019-02-18 NOTE — Patient Instructions (Signed)

## 2019-02-19 NOTE — Addendum Note (Signed)
Addended by: Pamala Hurry on: 02/19/2019 08:27 AM   Modules accepted: Orders

## 2019-06-02 DIAGNOSIS — I2510 Atherosclerotic heart disease of native coronary artery without angina pectoris: Secondary | ICD-10-CM

## 2019-06-02 DIAGNOSIS — I959 Hypotension, unspecified: Secondary | ICD-10-CM | POA: Diagnosis not present

## 2019-06-02 DIAGNOSIS — K828 Other specified diseases of gallbladder: Secondary | ICD-10-CM | POA: Diagnosis not present

## 2019-06-02 DIAGNOSIS — R079 Chest pain, unspecified: Secondary | ICD-10-CM | POA: Diagnosis not present

## 2019-06-02 DIAGNOSIS — R0789 Other chest pain: Secondary | ICD-10-CM | POA: Diagnosis not present

## 2019-06-02 DIAGNOSIS — E785 Hyperlipidemia, unspecified: Secondary | ICD-10-CM | POA: Diagnosis not present

## 2019-06-02 DIAGNOSIS — K219 Gastro-esophageal reflux disease without esophagitis: Secondary | ICD-10-CM | POA: Diagnosis not present

## 2019-06-02 DIAGNOSIS — R1013 Epigastric pain: Secondary | ICD-10-CM | POA: Diagnosis not present

## 2019-06-02 DIAGNOSIS — J449 Chronic obstructive pulmonary disease, unspecified: Secondary | ICD-10-CM | POA: Diagnosis not present

## 2019-06-02 DIAGNOSIS — R1084 Generalized abdominal pain: Secondary | ICD-10-CM | POA: Diagnosis not present

## 2019-06-02 DIAGNOSIS — J9811 Atelectasis: Secondary | ICD-10-CM | POA: Diagnosis not present

## 2019-06-03 DIAGNOSIS — J9811 Atelectasis: Secondary | ICD-10-CM | POA: Diagnosis not present

## 2019-06-03 DIAGNOSIS — K915 Postcholecystectomy syndrome: Secondary | ICD-10-CM | POA: Diagnosis not present

## 2019-06-03 DIAGNOSIS — R079 Chest pain, unspecified: Secondary | ICD-10-CM | POA: Diagnosis not present

## 2019-06-03 DIAGNOSIS — J90 Pleural effusion, not elsewhere classified: Secondary | ICD-10-CM | POA: Diagnosis not present

## 2019-06-03 DIAGNOSIS — K219 Gastro-esophageal reflux disease without esophagitis: Secondary | ICD-10-CM | POA: Diagnosis not present

## 2019-06-03 DIAGNOSIS — M1990 Unspecified osteoarthritis, unspecified site: Secondary | ICD-10-CM | POA: Diagnosis not present

## 2019-06-03 DIAGNOSIS — J449 Chronic obstructive pulmonary disease, unspecified: Secondary | ICD-10-CM | POA: Diagnosis not present

## 2019-06-03 DIAGNOSIS — R932 Abnormal findings on diagnostic imaging of liver and biliary tract: Secondary | ICD-10-CM | POA: Diagnosis not present

## 2019-06-03 DIAGNOSIS — Z87891 Personal history of nicotine dependence: Secondary | ICD-10-CM | POA: Diagnosis not present

## 2019-06-03 DIAGNOSIS — Z7982 Long term (current) use of aspirin: Secondary | ICD-10-CM | POA: Diagnosis not present

## 2019-06-03 DIAGNOSIS — R933 Abnormal findings on diagnostic imaging of other parts of digestive tract: Secondary | ICD-10-CM | POA: Diagnosis not present

## 2019-06-03 DIAGNOSIS — Z91041 Radiographic dye allergy status: Secondary | ICD-10-CM | POA: Diagnosis not present

## 2019-06-03 DIAGNOSIS — I252 Old myocardial infarction: Secondary | ICD-10-CM | POA: Diagnosis not present

## 2019-06-03 DIAGNOSIS — I2510 Atherosclerotic heart disease of native coronary artery without angina pectoris: Secondary | ICD-10-CM | POA: Diagnosis not present

## 2019-06-03 DIAGNOSIS — E039 Hypothyroidism, unspecified: Secondary | ICD-10-CM | POA: Diagnosis not present

## 2019-06-03 DIAGNOSIS — Z888 Allergy status to other drugs, medicaments and biological substances status: Secondary | ICD-10-CM | POA: Diagnosis not present

## 2019-06-03 DIAGNOSIS — I10 Essential (primary) hypertension: Secondary | ICD-10-CM | POA: Diagnosis not present

## 2019-06-03 DIAGNOSIS — I998 Other disorder of circulatory system: Secondary | ICD-10-CM | POA: Diagnosis not present

## 2019-06-03 DIAGNOSIS — Z79899 Other long term (current) drug therapy: Secondary | ICD-10-CM | POA: Diagnosis not present

## 2019-06-03 DIAGNOSIS — E785 Hyperlipidemia, unspecified: Secondary | ICD-10-CM | POA: Diagnosis not present

## 2019-06-03 DIAGNOSIS — R1013 Epigastric pain: Secondary | ICD-10-CM | POA: Diagnosis not present

## 2019-06-03 DIAGNOSIS — K838 Other specified diseases of biliary tract: Secondary | ICD-10-CM | POA: Diagnosis not present

## 2019-06-03 DIAGNOSIS — R945 Abnormal results of liver function studies: Secondary | ICD-10-CM | POA: Diagnosis not present

## 2019-06-03 DIAGNOSIS — K811 Chronic cholecystitis: Secondary | ICD-10-CM | POA: Diagnosis not present

## 2019-06-03 DIAGNOSIS — K828 Other specified diseases of gallbladder: Secondary | ICD-10-CM | POA: Diagnosis not present

## 2019-06-04 DIAGNOSIS — K811 Chronic cholecystitis: Secondary | ICD-10-CM | POA: Diagnosis not present

## 2019-06-04 DIAGNOSIS — K219 Gastro-esophageal reflux disease without esophagitis: Secondary | ICD-10-CM | POA: Diagnosis not present

## 2019-06-04 DIAGNOSIS — R1013 Epigastric pain: Secondary | ICD-10-CM | POA: Diagnosis not present

## 2019-06-04 DIAGNOSIS — E785 Hyperlipidemia, unspecified: Secondary | ICD-10-CM | POA: Diagnosis not present

## 2019-06-04 DIAGNOSIS — J449 Chronic obstructive pulmonary disease, unspecified: Secondary | ICD-10-CM | POA: Diagnosis not present

## 2019-06-05 DIAGNOSIS — E785 Hyperlipidemia, unspecified: Secondary | ICD-10-CM | POA: Diagnosis not present

## 2019-06-05 DIAGNOSIS — K219 Gastro-esophageal reflux disease without esophagitis: Secondary | ICD-10-CM | POA: Diagnosis not present

## 2019-06-05 DIAGNOSIS — J449 Chronic obstructive pulmonary disease, unspecified: Secondary | ICD-10-CM | POA: Diagnosis not present

## 2019-06-05 DIAGNOSIS — R945 Abnormal results of liver function studies: Secondary | ICD-10-CM | POA: Diagnosis not present

## 2019-06-05 DIAGNOSIS — R933 Abnormal findings on diagnostic imaging of other parts of digestive tract: Secondary | ICD-10-CM | POA: Diagnosis not present

## 2019-06-05 DIAGNOSIS — R1013 Epigastric pain: Secondary | ICD-10-CM | POA: Diagnosis not present

## 2019-06-06 DIAGNOSIS — K219 Gastro-esophageal reflux disease without esophagitis: Secondary | ICD-10-CM | POA: Diagnosis not present

## 2019-06-06 DIAGNOSIS — R1013 Epigastric pain: Secondary | ICD-10-CM | POA: Diagnosis not present

## 2019-06-06 DIAGNOSIS — J449 Chronic obstructive pulmonary disease, unspecified: Secondary | ICD-10-CM | POA: Diagnosis not present

## 2019-06-06 DIAGNOSIS — E785 Hyperlipidemia, unspecified: Secondary | ICD-10-CM | POA: Diagnosis not present

## 2019-06-14 DIAGNOSIS — K811 Chronic cholecystitis: Secondary | ICD-10-CM | POA: Diagnosis not present

## 2019-06-14 DIAGNOSIS — K828 Other specified diseases of gallbladder: Secondary | ICD-10-CM | POA: Diagnosis not present

## 2019-06-14 DIAGNOSIS — R079 Chest pain, unspecified: Secondary | ICD-10-CM | POA: Diagnosis not present

## 2019-06-14 DIAGNOSIS — R932 Abnormal findings on diagnostic imaging of liver and biliary tract: Secondary | ICD-10-CM | POA: Diagnosis not present

## 2019-06-14 DIAGNOSIS — R7989 Other specified abnormal findings of blood chemistry: Secondary | ICD-10-CM | POA: Diagnosis not present

## 2019-08-02 DIAGNOSIS — J449 Chronic obstructive pulmonary disease, unspecified: Secondary | ICD-10-CM | POA: Diagnosis not present

## 2019-08-02 DIAGNOSIS — E039 Hypothyroidism, unspecified: Secondary | ICD-10-CM | POA: Diagnosis not present

## 2019-08-02 DIAGNOSIS — I10 Essential (primary) hypertension: Secondary | ICD-10-CM | POA: Diagnosis not present

## 2019-08-02 DIAGNOSIS — R7301 Impaired fasting glucose: Secondary | ICD-10-CM | POA: Diagnosis not present

## 2019-08-02 DIAGNOSIS — E785 Hyperlipidemia, unspecified: Secondary | ICD-10-CM | POA: Diagnosis not present

## 2019-08-02 DIAGNOSIS — D751 Secondary polycythemia: Secondary | ICD-10-CM | POA: Diagnosis not present

## 2019-08-19 DIAGNOSIS — J440 Chronic obstructive pulmonary disease with acute lower respiratory infection: Secondary | ICD-10-CM | POA: Diagnosis not present

## 2019-08-19 DIAGNOSIS — J209 Acute bronchitis, unspecified: Secondary | ICD-10-CM | POA: Diagnosis not present

## 2019-09-16 DIAGNOSIS — E785 Hyperlipidemia, unspecified: Secondary | ICD-10-CM | POA: Diagnosis not present

## 2019-09-16 DIAGNOSIS — Z139 Encounter for screening, unspecified: Secondary | ICD-10-CM | POA: Diagnosis not present

## 2019-09-16 DIAGNOSIS — Z0000 Encounter for general adult medical examination without abnormal findings: Secondary | ICD-10-CM | POA: Diagnosis not present

## 2019-11-23 DIAGNOSIS — R531 Weakness: Secondary | ICD-10-CM | POA: Diagnosis not present

## 2019-11-23 DIAGNOSIS — J9601 Acute respiratory failure with hypoxia: Secondary | ICD-10-CM | POA: Diagnosis not present

## 2019-11-23 DIAGNOSIS — E785 Hyperlipidemia, unspecified: Secondary | ICD-10-CM | POA: Diagnosis not present

## 2019-11-23 DIAGNOSIS — R1013 Epigastric pain: Secondary | ICD-10-CM | POA: Diagnosis not present

## 2019-11-23 DIAGNOSIS — R112 Nausea with vomiting, unspecified: Secondary | ICD-10-CM | POA: Diagnosis not present

## 2019-11-23 DIAGNOSIS — Z888 Allergy status to other drugs, medicaments and biological substances status: Secondary | ICD-10-CM | POA: Diagnosis not present

## 2019-11-23 DIAGNOSIS — E7800 Pure hypercholesterolemia, unspecified: Secondary | ICD-10-CM | POA: Diagnosis not present

## 2019-11-23 DIAGNOSIS — A4159 Other Gram-negative sepsis: Secondary | ICD-10-CM | POA: Diagnosis not present

## 2019-11-23 DIAGNOSIS — Z79899 Other long term (current) drug therapy: Secondary | ICD-10-CM | POA: Diagnosis not present

## 2019-11-23 DIAGNOSIS — M1990 Unspecified osteoarthritis, unspecified site: Secondary | ICD-10-CM | POA: Diagnosis not present

## 2019-11-23 DIAGNOSIS — K219 Gastro-esophageal reflux disease without esophagitis: Secondary | ICD-10-CM | POA: Diagnosis not present

## 2019-11-23 DIAGNOSIS — Z7982 Long term (current) use of aspirin: Secondary | ICD-10-CM | POA: Diagnosis not present

## 2019-11-23 DIAGNOSIS — J90 Pleural effusion, not elsewhere classified: Secondary | ICD-10-CM | POA: Diagnosis not present

## 2019-11-23 DIAGNOSIS — K8021 Calculus of gallbladder without cholecystitis with obstruction: Secondary | ICD-10-CM | POA: Diagnosis not present

## 2019-11-23 DIAGNOSIS — R1012 Left upper quadrant pain: Secondary | ICD-10-CM | POA: Diagnosis not present

## 2019-11-23 DIAGNOSIS — R1110 Vomiting, unspecified: Secondary | ICD-10-CM | POA: Diagnosis not present

## 2019-11-23 DIAGNOSIS — E876 Hypokalemia: Secondary | ICD-10-CM | POA: Diagnosis not present

## 2019-11-23 DIAGNOSIS — K8050 Calculus of bile duct without cholangitis or cholecystitis without obstruction: Secondary | ICD-10-CM | POA: Diagnosis not present

## 2019-11-23 DIAGNOSIS — J441 Chronic obstructive pulmonary disease with (acute) exacerbation: Secondary | ICD-10-CM | POA: Diagnosis not present

## 2019-11-23 DIAGNOSIS — Z9981 Dependence on supplemental oxygen: Secondary | ICD-10-CM | POA: Diagnosis not present

## 2019-11-23 DIAGNOSIS — R110 Nausea: Secondary | ICD-10-CM | POA: Diagnosis not present

## 2019-11-23 DIAGNOSIS — E039 Hypothyroidism, unspecified: Secondary | ICD-10-CM | POA: Diagnosis not present

## 2019-11-23 DIAGNOSIS — K8051 Calculus of bile duct without cholangitis or cholecystitis with obstruction: Secondary | ICD-10-CM | POA: Diagnosis not present

## 2019-11-23 DIAGNOSIS — J449 Chronic obstructive pulmonary disease, unspecified: Secondary | ICD-10-CM | POA: Diagnosis not present

## 2019-11-23 DIAGNOSIS — M549 Dorsalgia, unspecified: Secondary | ICD-10-CM | POA: Diagnosis not present

## 2019-11-23 DIAGNOSIS — G8929 Other chronic pain: Secondary | ICD-10-CM | POA: Diagnosis not present

## 2019-11-23 DIAGNOSIS — Z91041 Radiographic dye allergy status: Secondary | ICD-10-CM | POA: Diagnosis not present

## 2019-11-23 DIAGNOSIS — I10 Essential (primary) hypertension: Secondary | ICD-10-CM | POA: Diagnosis not present

## 2019-11-23 DIAGNOSIS — J440 Chronic obstructive pulmonary disease with acute lower respiratory infection: Secondary | ICD-10-CM | POA: Diagnosis not present

## 2019-11-23 DIAGNOSIS — I252 Old myocardial infarction: Secondary | ICD-10-CM | POA: Diagnosis not present

## 2019-11-23 DIAGNOSIS — I2510 Atherosclerotic heart disease of native coronary artery without angina pectoris: Secondary | ICD-10-CM | POA: Diagnosis not present

## 2019-11-25 DIAGNOSIS — K8050 Calculus of bile duct without cholangitis or cholecystitis without obstruction: Secondary | ICD-10-CM | POA: Diagnosis not present

## 2019-11-25 DIAGNOSIS — J449 Chronic obstructive pulmonary disease, unspecified: Secondary | ICD-10-CM | POA: Diagnosis not present

## 2019-11-25 DIAGNOSIS — I10 Essential (primary) hypertension: Secondary | ICD-10-CM | POA: Diagnosis not present

## 2019-11-25 DIAGNOSIS — K8021 Calculus of gallbladder without cholecystitis with obstruction: Secondary | ICD-10-CM | POA: Diagnosis not present

## 2019-12-06 DIAGNOSIS — Z23 Encounter for immunization: Secondary | ICD-10-CM | POA: Diagnosis not present

## 2019-12-06 DIAGNOSIS — A4159 Other Gram-negative sepsis: Secondary | ICD-10-CM | POA: Diagnosis not present

## 2019-12-06 DIAGNOSIS — K8050 Calculus of bile duct without cholangitis or cholecystitis without obstruction: Secondary | ICD-10-CM | POA: Diagnosis not present

## 2019-12-06 DIAGNOSIS — A414 Sepsis due to anaerobes: Secondary | ICD-10-CM | POA: Diagnosis not present

## 2019-12-06 DIAGNOSIS — J449 Chronic obstructive pulmonary disease, unspecified: Secondary | ICD-10-CM | POA: Diagnosis not present

## 2020-01-31 DIAGNOSIS — J449 Chronic obstructive pulmonary disease, unspecified: Secondary | ICD-10-CM | POA: Diagnosis not present

## 2020-01-31 DIAGNOSIS — D751 Secondary polycythemia: Secondary | ICD-10-CM | POA: Diagnosis not present

## 2020-01-31 DIAGNOSIS — E039 Hypothyroidism, unspecified: Secondary | ICD-10-CM | POA: Diagnosis not present

## 2020-01-31 DIAGNOSIS — R7301 Impaired fasting glucose: Secondary | ICD-10-CM | POA: Diagnosis not present

## 2020-01-31 DIAGNOSIS — E785 Hyperlipidemia, unspecified: Secondary | ICD-10-CM | POA: Diagnosis not present

## 2020-01-31 DIAGNOSIS — I10 Essential (primary) hypertension: Secondary | ICD-10-CM | POA: Diagnosis not present

## 2020-04-21 DIAGNOSIS — S51011A Laceration without foreign body of right elbow, initial encounter: Secondary | ICD-10-CM | POA: Diagnosis not present

## 2020-07-31 DIAGNOSIS — D751 Secondary polycythemia: Secondary | ICD-10-CM | POA: Diagnosis not present

## 2020-07-31 DIAGNOSIS — K219 Gastro-esophageal reflux disease without esophagitis: Secondary | ICD-10-CM | POA: Diagnosis not present

## 2020-07-31 DIAGNOSIS — E039 Hypothyroidism, unspecified: Secondary | ICD-10-CM | POA: Diagnosis not present

## 2020-07-31 DIAGNOSIS — K449 Diaphragmatic hernia without obstruction or gangrene: Secondary | ICD-10-CM | POA: Diagnosis not present

## 2020-07-31 DIAGNOSIS — R7301 Impaired fasting glucose: Secondary | ICD-10-CM | POA: Diagnosis not present

## 2020-07-31 DIAGNOSIS — J449 Chronic obstructive pulmonary disease, unspecified: Secondary | ICD-10-CM | POA: Diagnosis not present

## 2020-07-31 DIAGNOSIS — E785 Hyperlipidemia, unspecified: Secondary | ICD-10-CM | POA: Diagnosis not present

## 2020-07-31 DIAGNOSIS — I10 Essential (primary) hypertension: Secondary | ICD-10-CM | POA: Diagnosis not present

## 2020-07-31 DIAGNOSIS — I6523 Occlusion and stenosis of bilateral carotid arteries: Secondary | ICD-10-CM | POA: Diagnosis not present

## 2020-08-05 ENCOUNTER — Other Ambulatory Visit: Payer: Self-pay | Admitting: Cardiology

## 2020-09-13 DIAGNOSIS — J449 Chronic obstructive pulmonary disease, unspecified: Secondary | ICD-10-CM | POA: Diagnosis not present

## 2020-09-13 DIAGNOSIS — K219 Gastro-esophageal reflux disease without esophagitis: Secondary | ICD-10-CM | POA: Diagnosis not present

## 2020-09-13 DIAGNOSIS — I10 Essential (primary) hypertension: Secondary | ICD-10-CM | POA: Diagnosis not present

## 2020-09-20 DIAGNOSIS — Z139 Encounter for screening, unspecified: Secondary | ICD-10-CM | POA: Diagnosis not present

## 2020-09-20 DIAGNOSIS — Z0000 Encounter for general adult medical examination without abnormal findings: Secondary | ICD-10-CM | POA: Diagnosis not present

## 2020-09-20 DIAGNOSIS — E785 Hyperlipidemia, unspecified: Secondary | ICD-10-CM | POA: Diagnosis not present

## 2020-09-20 DIAGNOSIS — Z9181 History of falling: Secondary | ICD-10-CM | POA: Diagnosis not present

## 2020-11-12 DIAGNOSIS — E785 Hyperlipidemia, unspecified: Secondary | ICD-10-CM | POA: Diagnosis not present

## 2020-11-12 DIAGNOSIS — J449 Chronic obstructive pulmonary disease, unspecified: Secondary | ICD-10-CM | POA: Diagnosis not present

## 2020-12-16 DIAGNOSIS — E785 Hyperlipidemia, unspecified: Secondary | ICD-10-CM | POA: Diagnosis not present

## 2020-12-16 DIAGNOSIS — I10 Essential (primary) hypertension: Secondary | ICD-10-CM | POA: Diagnosis not present

## 2020-12-16 DIAGNOSIS — J449 Chronic obstructive pulmonary disease, unspecified: Secondary | ICD-10-CM | POA: Diagnosis not present

## 2021-01-29 DIAGNOSIS — I10 Essential (primary) hypertension: Secondary | ICD-10-CM | POA: Diagnosis not present

## 2021-01-29 DIAGNOSIS — K449 Diaphragmatic hernia without obstruction or gangrene: Secondary | ICD-10-CM | POA: Diagnosis not present

## 2021-01-29 DIAGNOSIS — K219 Gastro-esophageal reflux disease without esophagitis: Secondary | ICD-10-CM | POA: Diagnosis not present

## 2021-01-29 DIAGNOSIS — J449 Chronic obstructive pulmonary disease, unspecified: Secondary | ICD-10-CM | POA: Diagnosis not present

## 2021-01-29 DIAGNOSIS — E039 Hypothyroidism, unspecified: Secondary | ICD-10-CM | POA: Diagnosis not present

## 2021-01-29 DIAGNOSIS — E785 Hyperlipidemia, unspecified: Secondary | ICD-10-CM | POA: Diagnosis not present

## 2021-01-29 DIAGNOSIS — Z23 Encounter for immunization: Secondary | ICD-10-CM | POA: Diagnosis not present

## 2021-01-29 DIAGNOSIS — Z6824 Body mass index (BMI) 24.0-24.9, adult: Secondary | ICD-10-CM | POA: Diagnosis not present

## 2021-01-29 DIAGNOSIS — R7301 Impaired fasting glucose: Secondary | ICD-10-CM | POA: Diagnosis not present

## 2021-01-29 DIAGNOSIS — D751 Secondary polycythemia: Secondary | ICD-10-CM | POA: Diagnosis not present

## 2021-01-29 DIAGNOSIS — I6523 Occlusion and stenosis of bilateral carotid arteries: Secondary | ICD-10-CM | POA: Diagnosis not present

## 2021-03-03 DIAGNOSIS — J209 Acute bronchitis, unspecified: Secondary | ICD-10-CM | POA: Diagnosis not present

## 2021-03-03 DIAGNOSIS — J440 Chronic obstructive pulmonary disease with acute lower respiratory infection: Secondary | ICD-10-CM | POA: Diagnosis not present

## 2021-07-21 DIAGNOSIS — J440 Chronic obstructive pulmonary disease with acute lower respiratory infection: Secondary | ICD-10-CM | POA: Diagnosis not present

## 2021-07-21 DIAGNOSIS — J209 Acute bronchitis, unspecified: Secondary | ICD-10-CM | POA: Diagnosis not present

## 2021-07-30 DIAGNOSIS — E785 Hyperlipidemia, unspecified: Secondary | ICD-10-CM | POA: Diagnosis not present

## 2021-07-30 DIAGNOSIS — I10 Essential (primary) hypertension: Secondary | ICD-10-CM | POA: Diagnosis not present

## 2021-07-30 DIAGNOSIS — D751 Secondary polycythemia: Secondary | ICD-10-CM | POA: Diagnosis not present

## 2021-07-30 DIAGNOSIS — K449 Diaphragmatic hernia without obstruction or gangrene: Secondary | ICD-10-CM | POA: Diagnosis not present

## 2021-07-30 DIAGNOSIS — J449 Chronic obstructive pulmonary disease, unspecified: Secondary | ICD-10-CM | POA: Diagnosis not present

## 2021-07-30 DIAGNOSIS — I6523 Occlusion and stenosis of bilateral carotid arteries: Secondary | ICD-10-CM | POA: Diagnosis not present

## 2021-07-30 DIAGNOSIS — R7301 Impaired fasting glucose: Secondary | ICD-10-CM | POA: Diagnosis not present

## 2021-07-30 DIAGNOSIS — E039 Hypothyroidism, unspecified: Secondary | ICD-10-CM | POA: Diagnosis not present

## 2021-07-30 DIAGNOSIS — K219 Gastro-esophageal reflux disease without esophagitis: Secondary | ICD-10-CM | POA: Diagnosis not present

## 2021-08-17 DIAGNOSIS — I6523 Occlusion and stenosis of bilateral carotid arteries: Secondary | ICD-10-CM | POA: Diagnosis not present

## 2021-09-03 DIAGNOSIS — J40 Bronchitis, not specified as acute or chronic: Secondary | ICD-10-CM | POA: Diagnosis not present

## 2021-09-03 DIAGNOSIS — R059 Cough, unspecified: Secondary | ICD-10-CM | POA: Diagnosis not present

## 2021-09-03 DIAGNOSIS — Z7982 Long term (current) use of aspirin: Secondary | ICD-10-CM | POA: Diagnosis not present

## 2021-09-03 DIAGNOSIS — J449 Chronic obstructive pulmonary disease, unspecified: Secondary | ICD-10-CM | POA: Diagnosis not present

## 2021-09-03 DIAGNOSIS — K2970 Gastritis, unspecified, without bleeding: Secondary | ICD-10-CM | POA: Diagnosis not present

## 2021-09-03 DIAGNOSIS — K2900 Acute gastritis without bleeding: Secondary | ICD-10-CM | POA: Diagnosis not present

## 2021-09-03 DIAGNOSIS — Z20822 Contact with and (suspected) exposure to covid-19: Secondary | ICD-10-CM | POA: Diagnosis not present

## 2021-09-03 DIAGNOSIS — J209 Acute bronchitis, unspecified: Secondary | ICD-10-CM | POA: Diagnosis not present

## 2021-09-03 DIAGNOSIS — E039 Hypothyroidism, unspecified: Secondary | ICD-10-CM | POA: Diagnosis not present

## 2021-09-03 DIAGNOSIS — Z79899 Other long term (current) drug therapy: Secondary | ICD-10-CM | POA: Diagnosis not present

## 2021-09-03 DIAGNOSIS — R1110 Vomiting, unspecified: Secondary | ICD-10-CM | POA: Diagnosis not present

## 2021-09-03 DIAGNOSIS — J441 Chronic obstructive pulmonary disease with (acute) exacerbation: Secondary | ICD-10-CM | POA: Diagnosis not present

## 2021-09-03 DIAGNOSIS — N390 Urinary tract infection, site not specified: Secondary | ICD-10-CM | POA: Diagnosis not present

## 2021-09-03 DIAGNOSIS — J440 Chronic obstructive pulmonary disease with acute lower respiratory infection: Secondary | ICD-10-CM | POA: Diagnosis not present

## 2021-09-17 DIAGNOSIS — J209 Acute bronchitis, unspecified: Secondary | ICD-10-CM | POA: Diagnosis not present

## 2021-09-17 DIAGNOSIS — J440 Chronic obstructive pulmonary disease with acute lower respiratory infection: Secondary | ICD-10-CM | POA: Diagnosis not present

## 2021-09-17 DIAGNOSIS — K529 Noninfective gastroenteritis and colitis, unspecified: Secondary | ICD-10-CM | POA: Diagnosis not present

## 2021-09-17 DIAGNOSIS — N390 Urinary tract infection, site not specified: Secondary | ICD-10-CM | POA: Diagnosis not present

## 2021-09-27 DIAGNOSIS — Z888 Allergy status to other drugs, medicaments and biological substances status: Secondary | ICD-10-CM | POA: Diagnosis not present

## 2021-09-27 DIAGNOSIS — Z7982 Long term (current) use of aspirin: Secondary | ICD-10-CM | POA: Diagnosis not present

## 2021-09-27 DIAGNOSIS — G8929 Other chronic pain: Secondary | ICD-10-CM | POA: Diagnosis not present

## 2021-09-27 DIAGNOSIS — I2510 Atherosclerotic heart disease of native coronary artery without angina pectoris: Secondary | ICD-10-CM | POA: Diagnosis not present

## 2021-09-27 DIAGNOSIS — Z87891 Personal history of nicotine dependence: Secondary | ICD-10-CM | POA: Diagnosis not present

## 2021-09-27 DIAGNOSIS — I252 Old myocardial infarction: Secondary | ICD-10-CM | POA: Diagnosis not present

## 2021-09-27 DIAGNOSIS — Z91041 Radiographic dye allergy status: Secondary | ICD-10-CM | POA: Diagnosis not present

## 2021-09-27 DIAGNOSIS — M549 Dorsalgia, unspecified: Secondary | ICD-10-CM | POA: Diagnosis not present

## 2021-09-27 DIAGNOSIS — E785 Hyperlipidemia, unspecified: Secondary | ICD-10-CM | POA: Diagnosis not present

## 2021-09-27 DIAGNOSIS — K219 Gastro-esophageal reflux disease without esophagitis: Secondary | ICD-10-CM | POA: Diagnosis not present

## 2021-09-27 DIAGNOSIS — E039 Hypothyroidism, unspecified: Secondary | ICD-10-CM | POA: Diagnosis not present

## 2021-09-27 DIAGNOSIS — I10 Essential (primary) hypertension: Secondary | ICD-10-CM | POA: Diagnosis not present

## 2021-09-27 DIAGNOSIS — Z79899 Other long term (current) drug therapy: Secondary | ICD-10-CM | POA: Diagnosis not present

## 2021-09-27 DIAGNOSIS — M1990 Unspecified osteoarthritis, unspecified site: Secondary | ICD-10-CM | POA: Diagnosis not present

## 2021-09-27 DIAGNOSIS — Z9981 Dependence on supplemental oxygen: Secondary | ICD-10-CM | POA: Diagnosis not present

## 2021-09-27 DIAGNOSIS — J449 Chronic obstructive pulmonary disease, unspecified: Secondary | ICD-10-CM | POA: Diagnosis not present

## 2021-09-27 DIAGNOSIS — Z7952 Long term (current) use of systemic steroids: Secondary | ICD-10-CM | POA: Diagnosis not present

## 2021-09-27 DIAGNOSIS — R0602 Shortness of breath: Secondary | ICD-10-CM | POA: Diagnosis not present

## 2021-09-27 DIAGNOSIS — J9691 Respiratory failure, unspecified with hypoxia: Secondary | ICD-10-CM | POA: Diagnosis not present

## 2021-09-27 DIAGNOSIS — J441 Chronic obstructive pulmonary disease with (acute) exacerbation: Secondary | ICD-10-CM | POA: Diagnosis not present

## 2021-09-27 DIAGNOSIS — J9621 Acute and chronic respiratory failure with hypoxia: Secondary | ICD-10-CM | POA: Diagnosis not present

## 2021-09-27 DIAGNOSIS — E876 Hypokalemia: Secondary | ICD-10-CM | POA: Diagnosis not present

## 2021-10-05 DIAGNOSIS — I10 Essential (primary) hypertension: Secondary | ICD-10-CM | POA: Diagnosis not present

## 2021-10-05 DIAGNOSIS — E876 Hypokalemia: Secondary | ICD-10-CM | POA: Diagnosis not present

## 2021-10-05 DIAGNOSIS — J9621 Acute and chronic respiratory failure with hypoxia: Secondary | ICD-10-CM | POA: Diagnosis not present

## 2021-10-30 DIAGNOSIS — J9691 Respiratory failure, unspecified with hypoxia: Secondary | ICD-10-CM | POA: Diagnosis not present

## 2021-10-30 DIAGNOSIS — J441 Chronic obstructive pulmonary disease with (acute) exacerbation: Secondary | ICD-10-CM | POA: Diagnosis not present

## 2021-11-11 DIAGNOSIS — E039 Hypothyroidism, unspecified: Secondary | ICD-10-CM | POA: Diagnosis not present

## 2021-11-11 DIAGNOSIS — J449 Chronic obstructive pulmonary disease, unspecified: Secondary | ICD-10-CM | POA: Diagnosis not present

## 2021-11-11 DIAGNOSIS — K449 Diaphragmatic hernia without obstruction or gangrene: Secondary | ICD-10-CM | POA: Diagnosis not present

## 2021-11-11 DIAGNOSIS — D751 Secondary polycythemia: Secondary | ICD-10-CM | POA: Diagnosis not present

## 2021-11-11 DIAGNOSIS — I6523 Occlusion and stenosis of bilateral carotid arteries: Secondary | ICD-10-CM | POA: Diagnosis not present

## 2021-11-11 DIAGNOSIS — I10 Essential (primary) hypertension: Secondary | ICD-10-CM | POA: Diagnosis not present

## 2021-11-11 DIAGNOSIS — E785 Hyperlipidemia, unspecified: Secondary | ICD-10-CM | POA: Diagnosis not present

## 2021-11-11 DIAGNOSIS — K219 Gastro-esophageal reflux disease without esophagitis: Secondary | ICD-10-CM | POA: Diagnosis not present

## 2021-11-11 DIAGNOSIS — R7301 Impaired fasting glucose: Secondary | ICD-10-CM | POA: Diagnosis not present

## 2021-11-29 DIAGNOSIS — J9691 Respiratory failure, unspecified with hypoxia: Secondary | ICD-10-CM | POA: Diagnosis not present

## 2021-11-29 DIAGNOSIS — J441 Chronic obstructive pulmonary disease with (acute) exacerbation: Secondary | ICD-10-CM | POA: Diagnosis not present

## 2021-11-30 ENCOUNTER — Telehealth: Payer: Self-pay

## 2021-11-30 NOTE — Patient Outreach (Signed)
  Care Coordination   Initial Visit Note   11/30/2021 Name: Margaret Villarreal MRN: [id number] DOB: 01-22-1941  Margaret Villarreal is a 81 y.o. year old female who sees Nicoletta Dress, MD for primary care. I spoke with  [name] by phone today. Patient request that I speak with her daughter [name].   What matters to the patients health and wellness today?  I reviewed and explained [facility] care coordination program to [name] ( patients daughter).  Daughter reports that patient is doing well and denies any needs at this time.   SDOH assessments and interventions completed:  No     Care Coordination Interventions Activated:  No  Care Coordination Interventions:  No, not indicated   Follow up plan: No further intervention required.   Encounter Outcome:  Pt. Refused   Tomasa Rand, RN, BSN, CEN [facility] [employer] [phone]

## 2021-12-30 DIAGNOSIS — J441 Chronic obstructive pulmonary disease with (acute) exacerbation: Secondary | ICD-10-CM | POA: Diagnosis not present

## 2021-12-30 DIAGNOSIS — J9691 Respiratory failure, unspecified with hypoxia: Secondary | ICD-10-CM | POA: Diagnosis not present

## 2022-01-29 DIAGNOSIS — J441 Chronic obstructive pulmonary disease with (acute) exacerbation: Secondary | ICD-10-CM | POA: Diagnosis not present

## 2022-01-29 DIAGNOSIS — J9691 Respiratory failure, unspecified with hypoxia: Secondary | ICD-10-CM | POA: Diagnosis not present

## 2022-04-01 DIAGNOSIS — J9691 Respiratory failure, unspecified with hypoxia: Secondary | ICD-10-CM | POA: Diagnosis not present

## 2022-04-01 DIAGNOSIS — J441 Chronic obstructive pulmonary disease with (acute) exacerbation: Secondary | ICD-10-CM | POA: Diagnosis not present

## 2022-04-03 DIAGNOSIS — Z9981 Dependence on supplemental oxygen: Secondary | ICD-10-CM | POA: Diagnosis not present

## 2022-04-03 DIAGNOSIS — M1990 Unspecified osteoarthritis, unspecified site: Secondary | ICD-10-CM | POA: Diagnosis not present

## 2022-04-03 DIAGNOSIS — Z7902 Long term (current) use of antithrombotics/antiplatelets: Secondary | ICD-10-CM | POA: Diagnosis not present

## 2022-04-03 DIAGNOSIS — J440 Chronic obstructive pulmonary disease with acute lower respiratory infection: Secondary | ICD-10-CM | POA: Diagnosis not present

## 2022-04-03 DIAGNOSIS — I252 Old myocardial infarction: Secondary | ICD-10-CM | POA: Diagnosis not present

## 2022-04-03 DIAGNOSIS — Z792 Long term (current) use of antibiotics: Secondary | ICD-10-CM | POA: Diagnosis not present

## 2022-04-03 DIAGNOSIS — R9431 Abnormal electrocardiogram [ECG] [EKG]: Secondary | ICD-10-CM | POA: Diagnosis not present

## 2022-04-03 DIAGNOSIS — E876 Hypokalemia: Secondary | ICD-10-CM | POA: Diagnosis not present

## 2022-04-03 DIAGNOSIS — J441 Chronic obstructive pulmonary disease with (acute) exacerbation: Secondary | ICD-10-CM | POA: Diagnosis not present

## 2022-04-03 DIAGNOSIS — I2510 Atherosclerotic heart disease of native coronary artery without angina pectoris: Secondary | ICD-10-CM | POA: Diagnosis not present

## 2022-04-03 DIAGNOSIS — E039 Hypothyroidism, unspecified: Secondary | ICD-10-CM | POA: Diagnosis not present

## 2022-04-03 DIAGNOSIS — K219 Gastro-esophageal reflux disease without esophagitis: Secondary | ICD-10-CM | POA: Diagnosis not present

## 2022-04-03 DIAGNOSIS — Z87891 Personal history of nicotine dependence: Secondary | ICD-10-CM | POA: Diagnosis not present

## 2022-04-03 DIAGNOSIS — Z91041 Radiographic dye allergy status: Secondary | ICD-10-CM | POA: Diagnosis not present

## 2022-04-03 DIAGNOSIS — Z79899 Other long term (current) drug therapy: Secondary | ICD-10-CM | POA: Diagnosis not present

## 2022-04-03 DIAGNOSIS — Z888 Allergy status to other drugs, medicaments and biological substances status: Secondary | ICD-10-CM | POA: Diagnosis not present

## 2022-04-03 DIAGNOSIS — U071 COVID-19: Secondary | ICD-10-CM | POA: Diagnosis not present

## 2022-04-03 DIAGNOSIS — Z7952 Long term (current) use of systemic steroids: Secondary | ICD-10-CM | POA: Diagnosis not present

## 2022-04-03 DIAGNOSIS — I10 Essential (primary) hypertension: Secondary | ICD-10-CM | POA: Diagnosis not present

## 2022-04-03 DIAGNOSIS — Z7982 Long term (current) use of aspirin: Secondary | ICD-10-CM | POA: Diagnosis not present

## 2022-04-03 DIAGNOSIS — J9611 Chronic respiratory failure with hypoxia: Secondary | ICD-10-CM | POA: Diagnosis not present

## 2022-04-03 DIAGNOSIS — Z87442 Personal history of urinary calculi: Secondary | ICD-10-CM | POA: Diagnosis not present

## 2022-04-03 DIAGNOSIS — E7800 Pure hypercholesterolemia, unspecified: Secondary | ICD-10-CM | POA: Diagnosis not present

## 2022-04-03 DIAGNOSIS — R0602 Shortness of breath: Secondary | ICD-10-CM | POA: Diagnosis not present

## 2022-04-12 DIAGNOSIS — U071 COVID-19: Secondary | ICD-10-CM | POA: Diagnosis not present

## 2022-04-12 DIAGNOSIS — E876 Hypokalemia: Secondary | ICD-10-CM | POA: Diagnosis not present

## 2022-04-12 DIAGNOSIS — J441 Chronic obstructive pulmonary disease with (acute) exacerbation: Secondary | ICD-10-CM | POA: Diagnosis not present

## 2022-04-17 DIAGNOSIS — J441 Chronic obstructive pulmonary disease with (acute) exacerbation: Secondary | ICD-10-CM | POA: Diagnosis not present

## 2022-04-30 DIAGNOSIS — J441 Chronic obstructive pulmonary disease with (acute) exacerbation: Secondary | ICD-10-CM | POA: Diagnosis not present

## 2022-04-30 DIAGNOSIS — J9691 Respiratory failure, unspecified with hypoxia: Secondary | ICD-10-CM | POA: Diagnosis not present

## 2022-05-08 DIAGNOSIS — J441 Chronic obstructive pulmonary disease with (acute) exacerbation: Secondary | ICD-10-CM | POA: Diagnosis not present

## 2022-05-08 DIAGNOSIS — R0600 Dyspnea, unspecified: Secondary | ICD-10-CM | POA: Diagnosis not present

## 2022-05-08 DIAGNOSIS — R059 Cough, unspecified: Secondary | ICD-10-CM | POA: Diagnosis not present

## 2022-05-08 DIAGNOSIS — R918 Other nonspecific abnormal finding of lung field: Secondary | ICD-10-CM | POA: Diagnosis not present

## 2022-05-15 DIAGNOSIS — J4540 Moderate persistent asthma, uncomplicated: Secondary | ICD-10-CM | POA: Diagnosis not present

## 2022-05-15 DIAGNOSIS — Z87891 Personal history of nicotine dependence: Secondary | ICD-10-CM | POA: Diagnosis not present

## 2022-05-31 DIAGNOSIS — J441 Chronic obstructive pulmonary disease with (acute) exacerbation: Secondary | ICD-10-CM | POA: Diagnosis not present

## 2022-05-31 DIAGNOSIS — J9691 Respiratory failure, unspecified with hypoxia: Secondary | ICD-10-CM | POA: Diagnosis not present

## 2022-06-30 DIAGNOSIS — J441 Chronic obstructive pulmonary disease with (acute) exacerbation: Secondary | ICD-10-CM | POA: Diagnosis not present

## 2022-06-30 DIAGNOSIS — J9691 Respiratory failure, unspecified with hypoxia: Secondary | ICD-10-CM | POA: Diagnosis not present

## 2022-07-11 DIAGNOSIS — K5730 Diverticulosis of large intestine without perforation or abscess without bleeding: Secondary | ICD-10-CM | POA: Diagnosis not present

## 2022-07-11 DIAGNOSIS — K921 Melena: Secondary | ICD-10-CM | POA: Diagnosis not present

## 2022-07-11 DIAGNOSIS — I700 Atherosclerosis of aorta: Secondary | ICD-10-CM | POA: Diagnosis not present

## 2022-07-11 DIAGNOSIS — K922 Gastrointestinal hemorrhage, unspecified: Secondary | ICD-10-CM | POA: Diagnosis not present

## 2022-07-11 DIAGNOSIS — R112 Nausea with vomiting, unspecified: Secondary | ICD-10-CM | POA: Diagnosis not present

## 2022-07-11 DIAGNOSIS — I491 Atrial premature depolarization: Secondary | ICD-10-CM | POA: Diagnosis not present

## 2022-07-11 DIAGNOSIS — R531 Weakness: Secondary | ICD-10-CM | POA: Diagnosis not present

## 2022-07-11 DIAGNOSIS — I219 Acute myocardial infarction, unspecified: Secondary | ICD-10-CM | POA: Diagnosis not present

## 2022-07-11 DIAGNOSIS — E611 Iron deficiency: Secondary | ICD-10-CM | POA: Diagnosis not present

## 2022-07-11 DIAGNOSIS — J449 Chronic obstructive pulmonary disease, unspecified: Secondary | ICD-10-CM | POA: Diagnosis not present

## 2022-07-11 DIAGNOSIS — I10 Essential (primary) hypertension: Secondary | ICD-10-CM | POA: Diagnosis not present

## 2022-07-11 DIAGNOSIS — R109 Unspecified abdominal pain: Secondary | ICD-10-CM | POA: Diagnosis not present

## 2022-07-11 DIAGNOSIS — K4090 Unilateral inguinal hernia, without obstruction or gangrene, not specified as recurrent: Secondary | ICD-10-CM | POA: Diagnosis not present

## 2022-07-11 DIAGNOSIS — K5731 Diverticulosis of large intestine without perforation or abscess with bleeding: Secondary | ICD-10-CM | POA: Diagnosis not present

## 2022-07-11 DIAGNOSIS — D649 Anemia, unspecified: Secondary | ICD-10-CM | POA: Diagnosis not present

## 2022-07-11 DIAGNOSIS — D500 Iron deficiency anemia secondary to blood loss (chronic): Secondary | ICD-10-CM | POA: Diagnosis not present

## 2022-07-11 DIAGNOSIS — R9431 Abnormal electrocardiogram [ECG] [EKG]: Secondary | ICD-10-CM | POA: Diagnosis not present

## 2022-07-12 DIAGNOSIS — M810 Age-related osteoporosis without current pathological fracture: Secondary | ICD-10-CM | POA: Diagnosis not present

## 2022-07-12 DIAGNOSIS — Z79899 Other long term (current) drug therapy: Secondary | ICD-10-CM | POA: Diagnosis not present

## 2022-07-12 DIAGNOSIS — E039 Hypothyroidism, unspecified: Secondary | ICD-10-CM | POA: Diagnosis not present

## 2022-07-12 DIAGNOSIS — Z87891 Personal history of nicotine dependence: Secondary | ICD-10-CM | POA: Diagnosis not present

## 2022-07-12 DIAGNOSIS — Z7902 Long term (current) use of antithrombotics/antiplatelets: Secondary | ICD-10-CM | POA: Diagnosis not present

## 2022-07-12 DIAGNOSIS — E785 Hyperlipidemia, unspecified: Secondary | ICD-10-CM | POA: Diagnosis not present

## 2022-07-12 DIAGNOSIS — K5710 Diverticulosis of small intestine without perforation or abscess without bleeding: Secondary | ICD-10-CM | POA: Diagnosis not present

## 2022-07-12 DIAGNOSIS — M1990 Unspecified osteoarthritis, unspecified site: Secondary | ICD-10-CM | POA: Diagnosis not present

## 2022-07-12 DIAGNOSIS — K5730 Diverticulosis of large intestine without perforation or abscess without bleeding: Secondary | ICD-10-CM | POA: Diagnosis not present

## 2022-07-12 DIAGNOSIS — D62 Acute posthemorrhagic anemia: Secondary | ICD-10-CM | POA: Diagnosis not present

## 2022-07-12 DIAGNOSIS — I10 Essential (primary) hypertension: Secondary | ICD-10-CM | POA: Diagnosis not present

## 2022-07-12 DIAGNOSIS — J449 Chronic obstructive pulmonary disease, unspecified: Secondary | ICD-10-CM | POA: Diagnosis not present

## 2022-07-12 DIAGNOSIS — I252 Old myocardial infarction: Secondary | ICD-10-CM | POA: Diagnosis not present

## 2022-07-12 DIAGNOSIS — K922 Gastrointestinal hemorrhage, unspecified: Secondary | ICD-10-CM | POA: Diagnosis not present

## 2022-07-12 DIAGNOSIS — Z8249 Family history of ischemic heart disease and other diseases of the circulatory system: Secondary | ICD-10-CM | POA: Diagnosis not present

## 2022-07-12 DIAGNOSIS — I2510 Atherosclerotic heart disease of native coronary artery without angina pectoris: Secondary | ICD-10-CM | POA: Diagnosis not present

## 2022-07-12 DIAGNOSIS — J4489 Other specified chronic obstructive pulmonary disease: Secondary | ICD-10-CM | POA: Diagnosis not present

## 2022-07-12 DIAGNOSIS — K5900 Constipation, unspecified: Secondary | ICD-10-CM | POA: Diagnosis not present

## 2022-07-12 DIAGNOSIS — D649 Anemia, unspecified: Secondary | ICD-10-CM | POA: Diagnosis not present

## 2022-07-12 DIAGNOSIS — Z7982 Long term (current) use of aspirin: Secondary | ICD-10-CM | POA: Diagnosis not present

## 2022-07-12 DIAGNOSIS — K921 Melena: Secondary | ICD-10-CM | POA: Diagnosis not present

## 2022-07-12 DIAGNOSIS — K449 Diaphragmatic hernia without obstruction or gangrene: Secondary | ICD-10-CM | POA: Diagnosis not present

## 2022-07-12 DIAGNOSIS — Z833 Family history of diabetes mellitus: Secondary | ICD-10-CM | POA: Diagnosis not present

## 2022-07-12 DIAGNOSIS — Z836 Family history of other diseases of the respiratory system: Secondary | ICD-10-CM | POA: Diagnosis not present

## 2022-07-12 DIAGNOSIS — K219 Gastro-esophageal reflux disease without esophagitis: Secondary | ICD-10-CM | POA: Diagnosis not present

## 2022-07-12 DIAGNOSIS — Z7951 Long term (current) use of inhaled steroids: Secondary | ICD-10-CM | POA: Diagnosis not present

## 2022-07-12 DIAGNOSIS — K640 First degree hemorrhoids: Secondary | ICD-10-CM | POA: Diagnosis not present

## 2022-07-13 DIAGNOSIS — K921 Melena: Secondary | ICD-10-CM | POA: Diagnosis not present

## 2022-07-14 DIAGNOSIS — J449 Chronic obstructive pulmonary disease, unspecified: Secondary | ICD-10-CM | POA: Diagnosis not present

## 2022-07-14 DIAGNOSIS — M1990 Unspecified osteoarthritis, unspecified site: Secondary | ICD-10-CM | POA: Diagnosis not present

## 2022-07-14 DIAGNOSIS — K921 Melena: Secondary | ICD-10-CM | POA: Diagnosis not present

## 2022-07-21 DIAGNOSIS — Z23 Encounter for immunization: Secondary | ICD-10-CM | POA: Diagnosis not present

## 2022-07-21 DIAGNOSIS — D649 Anemia, unspecified: Secondary | ICD-10-CM | POA: Diagnosis not present

## 2022-07-21 DIAGNOSIS — Z7982 Long term (current) use of aspirin: Secondary | ICD-10-CM | POA: Diagnosis not present

## 2022-07-21 DIAGNOSIS — J449 Chronic obstructive pulmonary disease, unspecified: Secondary | ICD-10-CM | POA: Diagnosis not present

## 2022-07-21 DIAGNOSIS — D62 Acute posthemorrhagic anemia: Secondary | ICD-10-CM | POA: Diagnosis not present

## 2022-07-21 DIAGNOSIS — M7989 Other specified soft tissue disorders: Secondary | ICD-10-CM | POA: Diagnosis not present

## 2022-07-21 DIAGNOSIS — W19XXXA Unspecified fall, initial encounter: Secondary | ICD-10-CM | POA: Diagnosis not present

## 2022-07-21 DIAGNOSIS — S51011A Laceration without foreign body of right elbow, initial encounter: Secondary | ICD-10-CM | POA: Diagnosis not present

## 2022-07-21 DIAGNOSIS — D509 Iron deficiency anemia, unspecified: Secondary | ICD-10-CM | POA: Diagnosis not present

## 2022-07-21 DIAGNOSIS — K921 Melena: Secondary | ICD-10-CM | POA: Diagnosis not present

## 2022-07-21 DIAGNOSIS — Z7902 Long term (current) use of antithrombotics/antiplatelets: Secondary | ICD-10-CM | POA: Diagnosis not present

## 2022-07-26 ENCOUNTER — Telehealth: Payer: Self-pay | Admitting: *Deleted

## 2022-07-26 NOTE — Telephone Encounter (Signed)
Transition Care Management Unsuccessful Follow-up Telephone Call  Date of discharge and from where:  [name] 07/20/2022  Attempts:  1st Attempt  Reason for unsuccessful TCM follow-up call:  No answer/busy

## 2022-07-27 ENCOUNTER — Telehealth: Payer: Self-pay | Admitting: *Deleted

## 2022-07-27 DIAGNOSIS — J4540 Moderate persistent asthma, uncomplicated: Secondary | ICD-10-CM | POA: Diagnosis not present

## 2022-07-27 DIAGNOSIS — Z87891 Personal history of nicotine dependence: Secondary | ICD-10-CM | POA: Diagnosis not present

## 2022-07-27 NOTE — Telephone Encounter (Signed)
Transition Care Management Unsuccessful Follow-up Telephone Call  Date of discharge and from where:  [name] 07/21/2022  Attempts:  2nd Attempt  Reason for unsuccessful TCM follow-up call:  Left voice message

## 2022-07-31 DIAGNOSIS — J9691 Respiratory failure, unspecified with hypoxia: Secondary | ICD-10-CM | POA: Diagnosis not present

## 2022-07-31 DIAGNOSIS — J441 Chronic obstructive pulmonary disease with (acute) exacerbation: Secondary | ICD-10-CM | POA: Diagnosis not present

## 2022-08-02 ENCOUNTER — Encounter: Payer: Self-pay | Admitting: Internal Medicine

## 2022-08-07 DIAGNOSIS — D509 Iron deficiency anemia, unspecified: Secondary | ICD-10-CM | POA: Diagnosis not present

## 2022-08-24 DIAGNOSIS — J4540 Moderate persistent asthma, uncomplicated: Secondary | ICD-10-CM | POA: Diagnosis not present

## 2022-08-24 DIAGNOSIS — Z87891 Personal history of nicotine dependence: Secondary | ICD-10-CM | POA: Diagnosis not present

## 2022-08-26 DIAGNOSIS — K449 Diaphragmatic hernia without obstruction or gangrene: Secondary | ICD-10-CM | POA: Diagnosis not present

## 2022-08-26 DIAGNOSIS — E039 Hypothyroidism, unspecified: Secondary | ICD-10-CM | POA: Diagnosis not present

## 2022-08-26 DIAGNOSIS — I10 Essential (primary) hypertension: Secondary | ICD-10-CM | POA: Diagnosis not present

## 2022-08-26 DIAGNOSIS — J449 Chronic obstructive pulmonary disease, unspecified: Secondary | ICD-10-CM | POA: Diagnosis not present

## 2022-08-26 DIAGNOSIS — D751 Secondary polycythemia: Secondary | ICD-10-CM | POA: Diagnosis not present

## 2022-08-26 DIAGNOSIS — R7301 Impaired fasting glucose: Secondary | ICD-10-CM | POA: Diagnosis not present

## 2022-08-26 DIAGNOSIS — K219 Gastro-esophageal reflux disease without esophagitis: Secondary | ICD-10-CM | POA: Diagnosis not present

## 2022-08-26 DIAGNOSIS — I6523 Occlusion and stenosis of bilateral carotid arteries: Secondary | ICD-10-CM | POA: Diagnosis not present

## 2022-08-26 DIAGNOSIS — E785 Hyperlipidemia, unspecified: Secondary | ICD-10-CM | POA: Diagnosis not present

## 2022-08-30 DIAGNOSIS — J9691 Respiratory failure, unspecified with hypoxia: Secondary | ICD-10-CM | POA: Diagnosis not present

## 2022-08-30 DIAGNOSIS — J441 Chronic obstructive pulmonary disease with (acute) exacerbation: Secondary | ICD-10-CM | POA: Diagnosis not present

## 2022-09-30 DIAGNOSIS — J441 Chronic obstructive pulmonary disease with (acute) exacerbation: Secondary | ICD-10-CM | POA: Diagnosis not present

## 2022-09-30 DIAGNOSIS — J9691 Respiratory failure, unspecified with hypoxia: Secondary | ICD-10-CM | POA: Diagnosis not present

## 2022-10-31 DIAGNOSIS — J441 Chronic obstructive pulmonary disease with (acute) exacerbation: Secondary | ICD-10-CM | POA: Diagnosis not present

## 2022-10-31 DIAGNOSIS — J9691 Respiratory failure, unspecified with hypoxia: Secondary | ICD-10-CM | POA: Diagnosis not present

## 2022-11-09 DIAGNOSIS — M419 Scoliosis, unspecified: Secondary | ICD-10-CM | POA: Diagnosis not present

## 2022-11-09 DIAGNOSIS — M47816 Spondylosis without myelopathy or radiculopathy, lumbar region: Secondary | ICD-10-CM | POA: Diagnosis not present

## 2022-11-09 DIAGNOSIS — M5126 Other intervertebral disc displacement, lumbar region: Secondary | ICD-10-CM | POA: Diagnosis not present

## 2022-11-09 DIAGNOSIS — M5416 Radiculopathy, lumbar region: Secondary | ICD-10-CM | POA: Diagnosis not present

## 2022-11-09 DIAGNOSIS — M545 Low back pain, unspecified: Secondary | ICD-10-CM | POA: Diagnosis not present

## 2022-11-09 DIAGNOSIS — M5136 Other intervertebral disc degeneration, lumbar region: Secondary | ICD-10-CM | POA: Diagnosis not present

## 2022-11-22 DIAGNOSIS — M545 Low back pain, unspecified: Secondary | ICD-10-CM | POA: Diagnosis not present

## 2022-11-22 DIAGNOSIS — M5416 Radiculopathy, lumbar region: Secondary | ICD-10-CM | POA: Diagnosis not present

## 2022-11-22 DIAGNOSIS — Z23 Encounter for immunization: Secondary | ICD-10-CM | POA: Diagnosis not present

## 2022-11-30 DIAGNOSIS — J441 Chronic obstructive pulmonary disease with (acute) exacerbation: Secondary | ICD-10-CM | POA: Diagnosis not present

## 2022-11-30 DIAGNOSIS — J9691 Respiratory failure, unspecified with hypoxia: Secondary | ICD-10-CM | POA: Diagnosis not present

## 2022-12-13 ENCOUNTER — Telehealth: Payer: Self-pay

## 2022-12-13 NOTE — Telephone Encounter (Signed)
Transition Care Management Unsuccessful Follow-up Telephone Call  Date of discharge and from where:  11/09/2022 [facility]  Attempts:  1st Attempt  Reason for unsuccessful TCM follow-up call:  Left voice message  Azhia Siefken [facility]  [facility], [facility] Resource Care Guide Direct Dial: [phone]  Website: [email or web address]

## 2022-12-14 ENCOUNTER — Telehealth: Payer: Self-pay

## 2022-12-14 NOTE — Telephone Encounter (Signed)
Transition Care Management Follow-up Telephone Call Date of discharge and from where: 11/09/2022 [facility] How have you been since you were released from the hospital? Patient is feeling better. Any questions or concerns? No  Items Reviewed: Did the pt receive and understand the discharge instructions provided? Yes  Medications obtained and verified? Yes  Other? No  Any new allergies since your discharge? No  Dietary orders reviewed? Yes Do you have support at home? Yes   Follow up appointments reviewed:  PCP Hospital f/u appt confirmed?  Patient stated she followed up with PCP.  Scheduled to see  on  @ . Specialist Hospital f/u appt confirmed? No  Scheduled to see  on  @ . Are transportation arrangements needed? No  If their condition worsens, is the pt aware to call PCP or go to the Emergency Dept.? Yes Was the patient provided with contact information for the PCP's office or ED? Yes Was to pt encouraged to call back with questions or concerns? Yes   Gerilyn Stargell [facility]  [facility], [facility] Guide Direct Dial: [phone]  Website: [email or web address]

## 2022-12-31 DIAGNOSIS — J9691 Respiratory failure, unspecified with hypoxia: Secondary | ICD-10-CM | POA: Diagnosis not present

## 2022-12-31 DIAGNOSIS — J441 Chronic obstructive pulmonary disease with (acute) exacerbation: Secondary | ICD-10-CM | POA: Diagnosis not present

## 2023-01-30 DIAGNOSIS — J9691 Respiratory failure, unspecified with hypoxia: Secondary | ICD-10-CM | POA: Diagnosis not present

## 2023-01-30 DIAGNOSIS — J441 Chronic obstructive pulmonary disease with (acute) exacerbation: Secondary | ICD-10-CM | POA: Diagnosis not present

## 2023-02-22 DIAGNOSIS — Z87891 Personal history of nicotine dependence: Secondary | ICD-10-CM | POA: Diagnosis not present

## 2023-02-22 DIAGNOSIS — J4540 Moderate persistent asthma, uncomplicated: Secondary | ICD-10-CM | POA: Diagnosis not present

## 2023-07-06 DIAGNOSIS — K219 Gastro-esophageal reflux disease without esophagitis: Secondary | ICD-10-CM | POA: Diagnosis not present

## 2023-07-06 DIAGNOSIS — J449 Chronic obstructive pulmonary disease, unspecified: Secondary | ICD-10-CM | POA: Diagnosis not present

## 2023-07-06 DIAGNOSIS — D509 Iron deficiency anemia, unspecified: Secondary | ICD-10-CM | POA: Diagnosis not present

## 2023-07-06 DIAGNOSIS — K449 Diaphragmatic hernia without obstruction or gangrene: Secondary | ICD-10-CM | POA: Diagnosis not present

## 2023-07-06 DIAGNOSIS — I10 Essential (primary) hypertension: Secondary | ICD-10-CM | POA: Diagnosis not present

## 2023-07-06 DIAGNOSIS — I6523 Occlusion and stenosis of bilateral carotid arteries: Secondary | ICD-10-CM | POA: Diagnosis not present

## 2023-07-06 DIAGNOSIS — E785 Hyperlipidemia, unspecified: Secondary | ICD-10-CM | POA: Diagnosis not present

## 2023-07-06 DIAGNOSIS — R7301 Impaired fasting glucose: Secondary | ICD-10-CM | POA: Diagnosis not present

## 2023-07-06 DIAGNOSIS — Z79899 Other long term (current) drug therapy: Secondary | ICD-10-CM | POA: Diagnosis not present

## 2023-07-06 DIAGNOSIS — E039 Hypothyroidism, unspecified: Secondary | ICD-10-CM | POA: Diagnosis not present

## 2023-07-06 DIAGNOSIS — J309 Allergic rhinitis, unspecified: Secondary | ICD-10-CM | POA: Diagnosis not present

## 2023-08-12 DIAGNOSIS — Z87891 Personal history of nicotine dependence: Secondary | ICD-10-CM | POA: Diagnosis not present

## 2023-08-12 DIAGNOSIS — J9811 Atelectasis: Secondary | ICD-10-CM | POA: Diagnosis not present

## 2023-08-12 DIAGNOSIS — E7800 Pure hypercholesterolemia, unspecified: Secondary | ICD-10-CM | POA: Diagnosis not present

## 2023-08-12 DIAGNOSIS — I10 Essential (primary) hypertension: Secondary | ICD-10-CM | POA: Diagnosis not present

## 2023-08-12 DIAGNOSIS — I2510 Atherosclerotic heart disease of native coronary artery without angina pectoris: Secondary | ICD-10-CM | POA: Diagnosis not present

## 2023-08-12 DIAGNOSIS — I252 Old myocardial infarction: Secondary | ICD-10-CM | POA: Diagnosis not present

## 2023-08-12 DIAGNOSIS — J441 Chronic obstructive pulmonary disease with (acute) exacerbation: Secondary | ICD-10-CM | POA: Diagnosis not present

## 2023-08-13 DIAGNOSIS — J690 Pneumonitis due to inhalation of food and vomit: Secondary | ICD-10-CM | POA: Diagnosis not present

## 2023-08-13 DIAGNOSIS — J440 Chronic obstructive pulmonary disease with acute lower respiratory infection: Secondary | ICD-10-CM | POA: Diagnosis not present

## 2023-08-13 DIAGNOSIS — G9341 Metabolic encephalopathy: Secondary | ICD-10-CM | POA: Diagnosis not present

## 2023-08-13 DIAGNOSIS — J9621 Acute and chronic respiratory failure with hypoxia: Secondary | ICD-10-CM | POA: Diagnosis not present

## 2023-08-13 DIAGNOSIS — J441 Chronic obstructive pulmonary disease with (acute) exacerbation: Secondary | ICD-10-CM | POA: Diagnosis not present

## 2023-08-13 DIAGNOSIS — Z515 Encounter for palliative care: Secondary | ICD-10-CM | POA: Diagnosis not present

## 2023-08-13 DIAGNOSIS — E7800 Pure hypercholesterolemia, unspecified: Secondary | ICD-10-CM | POA: Diagnosis not present

## 2023-08-13 DIAGNOSIS — J449 Chronic obstructive pulmonary disease, unspecified: Secondary | ICD-10-CM | POA: Diagnosis not present

## 2023-08-13 DIAGNOSIS — I469 Cardiac arrest, cause unspecified: Secondary | ICD-10-CM | POA: Diagnosis not present

## 2023-08-13 DIAGNOSIS — M96A3 Multiple fractures of ribs associated with chest compression and cardiopulmonary resuscitation: Secondary | ICD-10-CM | POA: Diagnosis not present

## 2023-08-13 DIAGNOSIS — Z9981 Dependence on supplemental oxygen: Secondary | ICD-10-CM | POA: Diagnosis not present

## 2023-08-13 DIAGNOSIS — Z66 Do not resuscitate: Secondary | ICD-10-CM | POA: Diagnosis not present

## 2023-08-13 DIAGNOSIS — R404 Transient alteration of awareness: Secondary | ICD-10-CM | POA: Diagnosis not present

## 2023-08-13 DIAGNOSIS — G931 Anoxic brain damage, not elsewhere classified: Secondary | ICD-10-CM | POA: Diagnosis not present

## 2023-08-13 DIAGNOSIS — Z9911 Dependence on respirator [ventilator] status: Secondary | ICD-10-CM | POA: Diagnosis not present

## 2023-08-13 DIAGNOSIS — Z7982 Long term (current) use of aspirin: Secondary | ICD-10-CM | POA: Diagnosis not present

## 2023-08-13 DIAGNOSIS — I959 Hypotension, unspecified: Secondary | ICD-10-CM | POA: Diagnosis not present

## 2023-08-13 DIAGNOSIS — E872 Acidosis, unspecified: Secondary | ICD-10-CM | POA: Diagnosis not present

## 2023-08-13 DIAGNOSIS — I4901 Ventricular fibrillation: Secondary | ICD-10-CM | POA: Diagnosis not present

## 2023-08-13 DIAGNOSIS — J9690 Respiratory failure, unspecified, unspecified whether with hypoxia or hypercapnia: Secondary | ICD-10-CM | POA: Diagnosis not present

## 2023-08-13 DIAGNOSIS — I739 Peripheral vascular disease, unspecified: Secondary | ICD-10-CM | POA: Diagnosis not present

## 2023-08-13 DIAGNOSIS — Z79899 Other long term (current) drug therapy: Secondary | ICD-10-CM | POA: Diagnosis not present

## 2023-08-13 DIAGNOSIS — I2489 Other forms of acute ischemic heart disease: Secondary | ICD-10-CM | POA: Diagnosis not present

## 2023-08-13 DIAGNOSIS — I2510 Atherosclerotic heart disease of native coronary artery without angina pectoris: Secondary | ICD-10-CM | POA: Diagnosis not present

## 2023-08-13 DIAGNOSIS — E039 Hypothyroidism, unspecified: Secondary | ICD-10-CM | POA: Diagnosis not present

## 2023-08-13 DIAGNOSIS — I10 Essential (primary) hypertension: Secondary | ICD-10-CM | POA: Diagnosis not present

## 2023-08-13 DIAGNOSIS — Z751 Person awaiting admission to adequate facility elsewhere: Secondary | ICD-10-CM | POA: Diagnosis not present

## 2023-08-13 DIAGNOSIS — R000 Tachycardia, unspecified: Secondary | ICD-10-CM | POA: Diagnosis not present

## 2023-08-13 DIAGNOSIS — I468 Cardiac arrest due to other underlying condition: Secondary | ICD-10-CM | POA: Diagnosis not present

## 2023-08-13 DIAGNOSIS — F17210 Nicotine dependence, cigarettes, uncomplicated: Secondary | ICD-10-CM | POA: Diagnosis not present

## 2023-08-13 DIAGNOSIS — Z4682 Encounter for fitting and adjustment of non-vascular catheter: Secondary | ICD-10-CM | POA: Diagnosis not present

## 2023-08-13 DIAGNOSIS — R092 Respiratory arrest: Secondary | ICD-10-CM | POA: Diagnosis not present

## 2023-08-14 DIAGNOSIS — I080 Rheumatic disorders of both mitral and aortic valves: Secondary | ICD-10-CM | POA: Diagnosis not present

## 2023-08-14 DIAGNOSIS — I469 Cardiac arrest, cause unspecified: Secondary | ICD-10-CM | POA: Diagnosis not present

## 2023-08-23 DIAGNOSIS — K5732 Diverticulitis of large intestine without perforation or abscess without bleeding: Secondary | ICD-10-CM | POA: Diagnosis not present

## 2023-08-23 DIAGNOSIS — N186 End stage renal disease: Secondary | ICD-10-CM | POA: Diagnosis not present

## 2023-09-14 DEATH — deceased
